# Patient Record
Sex: Female | Born: 1960
Health system: Southern US, Community
[De-identification: ages and names within clinical notes are randomized; demographics above are authoritative.]

## PROBLEM LIST (undated history)

## (undated) ENCOUNTER — Ambulatory Visit: Discharge status: Absent without leave | Payer: 59

## (undated) DIAGNOSIS — Z9889 Other specified postprocedural states: Secondary | ICD-10-CM

## (undated) DIAGNOSIS — R112 Nausea with vomiting, unspecified: Secondary | ICD-10-CM

## (undated) DIAGNOSIS — J302 Other seasonal allergic rhinitis: Secondary | ICD-10-CM

## (undated) DIAGNOSIS — I10 Essential (primary) hypertension: Secondary | ICD-10-CM

## (undated) HISTORY — DX: Other specified postprocedural states: Z98.890

## (undated) HISTORY — PX: WISDOM TOOTH EXTRACTION: SHX21

## (undated) HISTORY — PX: OTHER SURGICAL HISTORY: SHX169

## (undated) HISTORY — DX: Other seasonal allergic rhinitis: J30.2

## (undated) HISTORY — DX: Nausea with vomiting, unspecified: R11.2

---

## 1998-02-28 ENCOUNTER — Other Ambulatory Visit: Admission: RE | Admit: 1998-02-28 | Discharge: 1998-02-28 | Payer: Self-pay | Admitting: Obstetrics and Gynecology

## 1999-05-22 ENCOUNTER — Other Ambulatory Visit: Admission: RE | Admit: 1999-05-22 | Discharge: 1999-05-22 | Payer: Self-pay | Admitting: Obstetrics and Gynecology

## 2000-08-13 ENCOUNTER — Other Ambulatory Visit: Admission: RE | Admit: 2000-08-13 | Discharge: 2000-08-13 | Payer: Self-pay | Admitting: Obstetrics and Gynecology

## 2001-11-04 ENCOUNTER — Other Ambulatory Visit: Admission: RE | Admit: 2001-11-04 | Discharge: 2001-11-04 | Payer: Self-pay | Admitting: Obstetrics and Gynecology

## 2002-12-28 ENCOUNTER — Other Ambulatory Visit: Admission: RE | Admit: 2002-12-28 | Discharge: 2002-12-28 | Payer: Self-pay | Admitting: Obstetrics and Gynecology

## 2004-02-19 ENCOUNTER — Other Ambulatory Visit: Admission: RE | Admit: 2004-02-19 | Discharge: 2004-02-19 | Payer: Self-pay | Admitting: Obstetrics and Gynecology

## 2004-03-26 ENCOUNTER — Encounter: Admission: RE | Admit: 2004-03-26 | Discharge: 2004-03-26 | Payer: Self-pay | Admitting: Obstetrics and Gynecology

## 2004-09-26 ENCOUNTER — Encounter: Admission: RE | Admit: 2004-09-26 | Discharge: 2004-09-26 | Payer: Self-pay | Admitting: Obstetrics and Gynecology

## 2005-01-19 HISTORY — PX: CERVICAL SPINE SURGERY: SHX589

## 2005-03-13 ENCOUNTER — Other Ambulatory Visit: Admission: RE | Admit: 2005-03-13 | Discharge: 2005-03-13 | Payer: Self-pay | Admitting: Obstetrics and Gynecology

## 2005-04-08 ENCOUNTER — Encounter: Admission: RE | Admit: 2005-04-08 | Discharge: 2005-04-08 | Payer: Self-pay | Admitting: Neurological Surgery

## 2005-04-10 ENCOUNTER — Encounter: Admission: RE | Admit: 2005-04-10 | Discharge: 2005-04-10 | Payer: Self-pay | Admitting: Neurological Surgery

## 2005-04-16 ENCOUNTER — Ambulatory Visit (HOSPITAL_COMMUNITY): Admission: RE | Admit: 2005-04-16 | Discharge: 2005-04-16 | Payer: Self-pay | Admitting: Neurological Surgery

## 2005-05-19 ENCOUNTER — Encounter: Admission: RE | Admit: 2005-05-19 | Discharge: 2005-05-19 | Payer: Self-pay | Admitting: Neurological Surgery

## 2005-06-29 ENCOUNTER — Encounter: Admission: RE | Admit: 2005-06-29 | Discharge: 2005-06-29 | Payer: Self-pay | Admitting: Obstetrics and Gynecology

## 2005-07-01 ENCOUNTER — Encounter: Admission: RE | Admit: 2005-07-01 | Discharge: 2005-07-01 | Payer: Self-pay | Admitting: Obstetrics and Gynecology

## 2005-08-03 ENCOUNTER — Encounter: Admission: RE | Admit: 2005-08-03 | Discharge: 2005-08-03 | Payer: Self-pay | Admitting: Neurological Surgery

## 2010-02-09 ENCOUNTER — Encounter: Payer: Self-pay | Admitting: Obstetrics and Gynecology

## 2010-06-06 NOTE — Op Note (Signed)
NAME:  April Marsh, April Marsh               ACCOUNT NO.:  [id number]   MEDICAL RECORD NO.:  [id number]          PATIENT TYPE:  AMB   LOCATION:  SDS                          FACILITY:  [facility]   PHYSICIAN:  Tia Alert, MD     DATE OF BIRTH:  1960-07-31   DATE OF PROCEDURE:  04/16/2005  DATE OF DISCHARGE:                                 OPERATIVE REPORT   PREOPERATIVE DIAGNOSIS:  Cervical disc herniation C5-C6 on the right with  right C6 radiculopathy.   POSTOPERATIVE DIAGNOSIS:  Cervical disc herniation C5-C6 on the right with  right C6 radiculopathy.   PROCEDURE:  1.  Decompressive anterior cervical discectomy C5-C6.  2.  Anterior cervical arthrodesis C5-C6 utilizing a 6 mm PEEK interbody cage      packed with local autograft and DBX putty.  3.  Anterior cervical plating C5-C6 utilizing a 23 mm Avenger plate.   SURGEON:  Tia Alert, M.D.   ASSISTANT:  Reinaldo Meeker, M.D.   ANESTHESIA:  General endotracheal anesthesia.   COMPLICATIONS:  None apparent.   INDICATIONS FOR PROCEDURE:  April Marsh is a 50 year old white female who was  referred with neck and right arm pain.  She had tried medical management for  quite some time without significant relief.  She had an MRI and CT myelogram  which showed a herniated disk at C5-C6 on the right with cut off of the  right C6 nerve root sleeve. I recommended decompressive anterior cervical  discectomy and fusion with plating at C5-C6.  She understood the risks,  benefits, expected outcome, and wished to proceed.   DESCRIPTION OF PROCEDURE:  The patient was taken to the operating room  operating room and after induction of adequate general endotracheal  anesthesia, she was placed in the supine position on the operating room  table.  Her right anterior cervical region was prepped with DuraPrep and  draped in usual sterile fashion.  3 mL local anesthesia was injected and a  small transverse incision was made to the right of midline and  carried down  to the platysma which was elevated, opened and undermined with Metzenbaum  scissors.  I then dissected a plane medial to the sternocleidomastoid muscle  and internal carotid artery and lateral to the trachea and esophagus to  expose C5-C6.  Interoperative fluoroscopy confirmed my level and then the  longus colli muscles were taken down bilaterally and Shadowline retractors  were placed under these. The annulus at C5-C6 was incised and initial  discectomy was done with pituitary rongeurs and curved curettes.  We then  used the high-speed drill to drill the endplates.  These drill shavings were  saved in a mucous trap for later arthrodesis.  We drilled down to the level  of the posterior longitudinal ligament that and utilizing the operating  microscope, opened the posterior longitudinal ligament with a nerve hook and  then removed it in a circumferential fashion, undercutting the body C5 and  the top of C6.  Bilateral foraminotomies were performed but we performed a  wide foraminotomy at C5-C6 on the right side and  pulled several free  fragments from the foramen over the C6 nerve root.  We dissected to the  superior pedicle wall and followed the pedicle out distal to the pedicle  following the nerve distally into the foramen.  We then palpated with nerve  hook to assure adequate decompression. Once decompression was complete, we  irrigated with saline solution and then measured the interspace to be 6 mm.  We used a 6 mm PEEK interbody cage, packed this with local autograft saved  during drilling mixed with DBX putty, and tapped this into position at C5-C6  to perform arthrodesis.  We then used 23-mm ventral plate and placed two 13  mm variable angle screws into the bodies of C5 and C6.  These locked into  the plate by the plate's locking mechanism.  We then irrigated with saline  solution containing Bacitracin, dried all bleeding points with bipolar  cautery, and once  meticulous hemostasis was achieved, closed the platysma  with 3-0 Vicryl, closed the subcuticular tissue with 3-0 Vicryl, and closed  the skin with Benzoin and Steri-Strips.  The drapes removed, a sterile  dressing was applied.  The patient was awakened from general anesthesia and  transferred recovery room in stable condition.  At the end of procedure, all  sponge, needle and sponge counts were correct.      Tia Alert, MD  Electronically Signed     DSJ/MEDQ  D:  04/16/2005  T:  04/17/2005  Job:  [id number]

## 2011-05-06 ENCOUNTER — Other Ambulatory Visit: Payer: Self-pay | Admitting: Obstetrics and Gynecology

## 2011-05-06 DIAGNOSIS — R928 Other abnormal and inconclusive findings on diagnostic imaging of breast: Secondary | ICD-10-CM

## 2011-05-08 ENCOUNTER — Other Ambulatory Visit: Payer: Self-pay

## 2011-05-08 ENCOUNTER — Ambulatory Visit
Admission: RE | Admit: 2011-05-08 | Discharge: 2011-05-08 | Disposition: A | Discharge status: Home / Self Care | Payer: BC Managed Care – PPO | Source: Ambulatory Visit | Attending: Obstetrics and Gynecology | Admitting: Obstetrics and Gynecology

## 2011-05-08 ENCOUNTER — Other Ambulatory Visit: Payer: Self-pay | Admitting: Obstetrics and Gynecology

## 2011-05-08 DIAGNOSIS — R928 Other abnormal and inconclusive findings on diagnostic imaging of breast: Secondary | ICD-10-CM

## 2011-05-08 DIAGNOSIS — N6320 Unspecified lump in the left breast, unspecified quadrant: Secondary | ICD-10-CM

## 2011-05-19 ENCOUNTER — Other Ambulatory Visit: Payer: BC Managed Care – PPO

## 2011-05-20 ENCOUNTER — Ambulatory Visit
Admission: RE | Admit: 2011-05-20 | Discharge: 2011-05-20 | Disposition: A | Discharge status: Home / Self Care | Payer: BC Managed Care – PPO | Source: Ambulatory Visit | Attending: Obstetrics and Gynecology | Admitting: Obstetrics and Gynecology

## 2011-05-20 ENCOUNTER — Other Ambulatory Visit: Payer: Self-pay | Admitting: Obstetrics and Gynecology

## 2011-05-20 DIAGNOSIS — N6320 Unspecified lump in the left breast, unspecified quadrant: Secondary | ICD-10-CM

## 2011-05-20 DIAGNOSIS — R928 Other abnormal and inconclusive findings on diagnostic imaging of breast: Secondary | ICD-10-CM

## 2012-05-13 ENCOUNTER — Other Ambulatory Visit: Payer: Self-pay | Admitting: Obstetrics and Gynecology

## 2012-05-13 DIAGNOSIS — R928 Other abnormal and inconclusive findings on diagnostic imaging of breast: Secondary | ICD-10-CM

## 2012-05-30 ENCOUNTER — Other Ambulatory Visit: Payer: Self-pay | Admitting: Obstetrics and Gynecology

## 2012-05-30 DIAGNOSIS — R928 Other abnormal and inconclusive findings on diagnostic imaging of breast: Secondary | ICD-10-CM

## 2012-05-31 ENCOUNTER — Encounter (HOSPITAL_COMMUNITY): Payer: Self-pay

## 2012-05-31 ENCOUNTER — Ambulatory Visit (HOSPITAL_COMMUNITY)
Admission: RE | Admit: 2012-05-31 | Discharge: 2012-05-31 | Disposition: A | Discharge status: Home / Self Care | Payer: BC Managed Care – PPO | Source: Ambulatory Visit | Attending: Obstetrics and Gynecology | Admitting: Obstetrics and Gynecology

## 2012-05-31 VITALS — BP 142/86 | Temp 98.6°F | Ht <= 58 in | Wt 143.0 lb

## 2012-05-31 DIAGNOSIS — Z1239 Encounter for other screening for malignant neoplasm of breast: Secondary | ICD-10-CM

## 2012-05-31 DIAGNOSIS — N63 Unspecified lump in unspecified breast: Secondary | ICD-10-CM | POA: Insufficient documentation

## 2012-05-31 HISTORY — DX: Essential (primary) hypertension: I10

## 2012-05-31 NOTE — Patient Instructions (Addendum)
Taught patient how to perform BSE. Patient did not need a Pap smear today due to last Pap smear was 05/10/12 per patient. Told patient about free cervical cancer screenings to receive a Pap smear if would like one in 2 years. Let her know [facility] will cover Pap smears every 3 years unless has a history of abnormal Pap smears. Referred patient to the [facility] for right breast diagnostic mammogram and ultrasound per recommendation. Appointment scheduled for Thursday, Jun 09, 2012 at 0810. Patient aware of appointment and will be there. Patient verbalized understanding.

## 2012-05-31 NOTE — Progress Notes (Signed)
Patient referred to [facility] by the [facility] due to needing additional imaging of the right breast. Screening mammogram was completed 05/09/2012 at her physician's office.  Pap Smear:    Pap smear not completed today. Last Pap smear was 05/10/2012 and normal per patient. Per patient no history of an abnormal Pap smear. No Pap smear results in EPIC.  Physical exam: Breasts Breasts symmetrical. No skin abnormalities bilateral breasts. No nipple retraction bilateral breasts. No nipple discharge bilateral breasts. No lymphadenopathy. Palpated lump within right outer quadrant of breast. Palpated lump within the left breast at 6 o'clock under areola. No complaints of pain or tenderness on exam. Referred patient to the [facility] for right breast diagnostic mammogram and ultrasound per recommendation. Appointment scheduled for Thursday, Jun 09, 2012 at 0810.        Pelvic/Bimanual No Pap smear completed today since last Pap smear was 05/11/2011. Pap smear not indicated per [facility] guidelines.

## 2012-06-09 ENCOUNTER — Other Ambulatory Visit: Payer: BC Managed Care – PPO

## 2012-06-15 ENCOUNTER — Ambulatory Visit
Admission: RE | Admit: 2012-06-15 | Discharge: 2012-06-15 | Disposition: A | Discharge status: Home / Self Care | Payer: No Typology Code available for payment source | Source: Ambulatory Visit | Attending: Obstetrics and Gynecology | Admitting: Obstetrics and Gynecology

## 2012-06-15 DIAGNOSIS — R928 Other abnormal and inconclusive findings on diagnostic imaging of breast: Secondary | ICD-10-CM

## 2014-09-27 ENCOUNTER — Other Ambulatory Visit: Payer: Self-pay | Admitting: Obstetrics and Gynecology

## 2014-09-28 LAB — CYTOLOGY - PAP

## 2014-10-23 ENCOUNTER — Other Ambulatory Visit: Payer: Self-pay

## 2014-10-23 ENCOUNTER — Other Ambulatory Visit: Payer: Self-pay | Admitting: Obstetrics and Gynecology

## 2014-10-23 DIAGNOSIS — N6310 Unspecified lump in the right breast, unspecified quadrant: Secondary | ICD-10-CM

## 2014-10-26 ENCOUNTER — Other Ambulatory Visit: Payer: Self-pay

## 2014-11-01 ENCOUNTER — Ambulatory Visit
Admission: RE | Admit: 2014-11-01 | Discharge: 2014-11-01 | Disposition: A | Discharge status: Home / Self Care | Payer: PRIVATE HEALTH INSURANCE | Source: Ambulatory Visit | Attending: Obstetrics and Gynecology | Admitting: Obstetrics and Gynecology

## 2014-11-01 DIAGNOSIS — N6310 Unspecified lump in the right breast, unspecified quadrant: Secondary | ICD-10-CM

## 2016-06-24 DIAGNOSIS — M25572 Pain in left ankle and joints of left foot: Secondary | ICD-10-CM | POA: Diagnosis not present

## 2016-09-25 DIAGNOSIS — N390 Urinary tract infection, site not specified: Secondary | ICD-10-CM | POA: Diagnosis not present

## 2016-11-26 DIAGNOSIS — J329 Chronic sinusitis, unspecified: Secondary | ICD-10-CM | POA: Diagnosis not present

## 2016-11-26 DIAGNOSIS — J069 Acute upper respiratory infection, unspecified: Secondary | ICD-10-CM | POA: Diagnosis not present

## 2016-11-26 DIAGNOSIS — J209 Acute bronchitis, unspecified: Secondary | ICD-10-CM | POA: Diagnosis not present

## 2017-03-02 DIAGNOSIS — R350 Frequency of micturition: Secondary | ICD-10-CM | POA: Diagnosis not present

## 2017-03-02 DIAGNOSIS — R300 Dysuria: Secondary | ICD-10-CM | POA: Diagnosis not present

## 2017-03-22 DIAGNOSIS — E559 Vitamin D deficiency, unspecified: Secondary | ICD-10-CM | POA: Diagnosis not present

## 2017-03-22 DIAGNOSIS — Z6822 Body mass index (BMI) 22.0-22.9, adult: Secondary | ICD-10-CM | POA: Diagnosis not present

## 2017-03-22 DIAGNOSIS — Z01419 Encounter for gynecological examination (general) (routine) without abnormal findings: Secondary | ICD-10-CM | POA: Diagnosis not present

## 2017-03-22 DIAGNOSIS — Z1231 Encounter for screening mammogram for malignant neoplasm of breast: Secondary | ICD-10-CM | POA: Diagnosis not present

## 2017-03-22 DIAGNOSIS — N912 Amenorrhea, unspecified: Secondary | ICD-10-CM | POA: Diagnosis not present

## 2017-05-08 DIAGNOSIS — N3091 Cystitis, unspecified with hematuria: Secondary | ICD-10-CM | POA: Diagnosis not present

## 2017-08-11 ENCOUNTER — Ambulatory Visit: Payer: PRIVATE HEALTH INSURANCE | Admitting: Urology

## 2017-11-10 ENCOUNTER — Ambulatory Visit: Payer: BLUE CROSS/BLUE SHIELD | Admitting: Urology

## 2017-11-10 ENCOUNTER — Other Ambulatory Visit (HOSPITAL_COMMUNITY)
Admission: RE | Admit: 2017-11-10 | Discharge: 2017-11-10 | Disposition: A | Discharge status: Home / Self Care | Payer: BLUE CROSS/BLUE SHIELD | Source: Ambulatory Visit | Attending: Urology | Admitting: Urology

## 2017-11-10 DIAGNOSIS — R311 Benign essential microscopic hematuria: Secondary | ICD-10-CM

## 2017-11-10 DIAGNOSIS — R350 Frequency of micturition: Secondary | ICD-10-CM

## 2017-11-10 LAB — URINALYSIS, COMPLETE (UACMP) WITH MICROSCOPIC
Bilirubin Urine: NEGATIVE
Glucose, UA: NEGATIVE mg/dL
Ketones, ur: NEGATIVE mg/dL
LEUKOCYTES UA: NEGATIVE
Nitrite: NEGATIVE
PH: 6 (ref 5.0–8.0)
Protein, ur: NEGATIVE mg/dL
SPECIFIC GRAVITY, URINE: 1.018 (ref 1.005–1.030)

## 2017-12-22 ENCOUNTER — Ambulatory Visit: Payer: BLUE CROSS/BLUE SHIELD | Admitting: Urology

## 2017-12-22 DIAGNOSIS — R311 Benign essential microscopic hematuria: Secondary | ICD-10-CM

## 2017-12-22 DIAGNOSIS — R350 Frequency of micturition: Secondary | ICD-10-CM | POA: Diagnosis not present

## 2018-02-09 DIAGNOSIS — J209 Acute bronchitis, unspecified: Secondary | ICD-10-CM | POA: Diagnosis not present

## 2018-02-09 DIAGNOSIS — J069 Acute upper respiratory infection, unspecified: Secondary | ICD-10-CM | POA: Diagnosis not present

## 2018-03-23 ENCOUNTER — Ambulatory Visit: Payer: BLUE CROSS/BLUE SHIELD | Admitting: Urology

## 2018-09-12 DIAGNOSIS — M898X7 Other specified disorders of bone, ankle and foot: Secondary | ICD-10-CM | POA: Diagnosis not present

## 2018-09-12 DIAGNOSIS — M79671 Pain in right foot: Secondary | ICD-10-CM | POA: Diagnosis not present

## 2018-09-12 DIAGNOSIS — M19071 Primary osteoarthritis, right ankle and foot: Secondary | ICD-10-CM | POA: Diagnosis not present

## 2018-09-21 ENCOUNTER — Ambulatory Visit: Payer: BLUE CROSS/BLUE SHIELD | Admitting: Urology

## 2018-11-16 ENCOUNTER — Ambulatory Visit (INDEPENDENT_AMBULATORY_CARE_PROVIDER_SITE_OTHER): Payer: BC Managed Care – PPO | Admitting: Urology

## 2018-11-16 DIAGNOSIS — R350 Frequency of micturition: Secondary | ICD-10-CM

## 2018-11-16 DIAGNOSIS — R311 Benign essential microscopic hematuria: Secondary | ICD-10-CM | POA: Diagnosis not present

## 2018-11-18 ENCOUNTER — Other Ambulatory Visit: Payer: Self-pay

## 2018-11-18 DIAGNOSIS — Z20822 Contact with and (suspected) exposure to covid-19: Secondary | ICD-10-CM

## 2018-11-18 DIAGNOSIS — J069 Acute upper respiratory infection, unspecified: Secondary | ICD-10-CM | POA: Diagnosis not present

## 2018-11-18 DIAGNOSIS — B349 Viral infection, unspecified: Secondary | ICD-10-CM | POA: Diagnosis not present

## 2018-11-20 LAB — NOVEL CORONAVIRUS, NAA: SARS-CoV-2, NAA: DETECTED — AB

## 2019-02-13 ENCOUNTER — Telehealth: Payer: Self-pay | Admitting: Urology

## 2019-02-13 NOTE — Telephone Encounter (Signed)
Pt states insurance is not covering the Myrbetriq that Dr Alyson Ingles gave her. 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 Can she get more samples until her new insurance kicks in on March 20, 2019?

## 2019-02-14 NOTE — Telephone Encounter (Signed)
I spoke with pt. Can you have Debra or Hope put 25mg  myrbetriq in a bag for this pt? She will come by to get meds today/tomorrow.  She needs 4 boxes ( 1 months worth) of 25mg  Myrbetriq samples.

## 2019-03-10 ENCOUNTER — Other Ambulatory Visit: Payer: Self-pay

## 2019-03-10 DIAGNOSIS — N3281 Overactive bladder: Secondary | ICD-10-CM

## 2019-03-10 MED ORDER — FESOTERODINE FUMARATE ER 4 MG PO TB24: 4.0000 mg | ORAL_TABLET | Freq: Every day | ORAL | 11 refills | Status: DC

## 2019-03-22 ENCOUNTER — Other Ambulatory Visit: Payer: Self-pay

## 2019-03-22 DIAGNOSIS — N3281 Overactive bladder: Secondary | ICD-10-CM

## 2019-03-22 MED ORDER — OXYBUTYNIN CHLORIDE ER 10 MG PO TB24: 10.0000 mg | ORAL_TABLET | Freq: Every day | ORAL | 11 refills | Status: DC

## 2019-04-17 ENCOUNTER — Ambulatory Visit: Payer: Self-pay

## 2019-04-17 ENCOUNTER — Ambulatory Visit: Payer: Self-pay | Attending: Internal Medicine

## 2019-04-17 DIAGNOSIS — Z23 Encounter for immunization: Secondary | ICD-10-CM

## 2019-04-17 NOTE — Progress Notes (Signed)
   Covid-19 Vaccination Clinic  Name:  April Marsh    MRN: [id number] DOB: [birth year]  04/17/2019  [name] was observed post Covid-19 immunization for 15 minutes without incident. She was provided with Vaccine Information Sheet and instruction to access the V-Safe system.   [name] was instructed to call 911 with any severe reactions post vaccine: Marland Kitchen Difficulty breathing  . Swelling of face and throat  . A fast heartbeat  . A bad rash all over body  . Dizziness and weakness   Immunizations Administered    Name Date Dose VIS Date Route   Pfizer COVID-19 Vaccine 04/17/2019  1:04 PM 0.3 mL 12/30/2018 Intramuscular   Manufacturer: [place]   Lot: [id number]   [place]: [id number]

## 2019-05-15 ENCOUNTER — Ambulatory Visit: Payer: Self-pay | Attending: Internal Medicine

## 2019-05-15 DIAGNOSIS — Z23 Encounter for immunization: Secondary | ICD-10-CM

## 2019-05-15 NOTE — Progress Notes (Signed)
   Covid-19 Vaccination Clinic  Name:  April Marsh    MRN: [id number] DOB: [birth year]  05/15/2019  [name] was observed post Covid-19 immunization for 15 minutes without incident. She was provided with Vaccine Information Sheet and instruction to access the V-Safe system.   [name] was instructed to call 911 with any severe reactions post vaccine: Marland Kitchen Difficulty breathing  . Swelling of face and throat  . A fast heartbeat  . A bad rash all over body  . Dizziness and weakness   Immunizations Administered    Name Date Dose VIS Date Route   Pfizer COVID-19 Vaccine 05/15/2019 12:21 PM 0.3 mL 03/15/2018 Intramuscular   Manufacturer: [place]   Lot: [id number]   [place]: [id number]

## 2020-01-29 ENCOUNTER — Encounter: Payer: Self-pay | Admitting: Emergency Medicine

## 2020-01-29 ENCOUNTER — Other Ambulatory Visit: Payer: Self-pay

## 2020-01-29 ENCOUNTER — Ambulatory Visit
Admission: EM | Admit: 2020-01-29 | Discharge: 2020-01-29 | Disposition: A | Discharge status: Home / Self Care | Payer: 59 | Attending: Emergency Medicine | Admitting: Emergency Medicine

## 2020-01-29 DIAGNOSIS — J069 Acute upper respiratory infection, unspecified: Secondary | ICD-10-CM | POA: Diagnosis not present

## 2020-01-29 DIAGNOSIS — Z1152 Encounter for screening for COVID-19: Secondary | ICD-10-CM

## 2020-01-29 MED ORDER — FLUTICASONE PROPIONATE 50 MCG/ACT NA SUSP: 1.0000 | Freq: Every day | NASAL | 0 refills | Status: DC

## 2020-01-29 MED ORDER — BENZONATATE 100 MG PO CAPS: 100.0000 mg | ORAL_CAPSULE | Freq: Three times a day (TID) | ORAL | 0 refills | Status: DC | PRN

## 2020-01-29 MED ORDER — AZITHROMYCIN 250 MG PO TABS: 250.0000 mg | ORAL_TABLET | Freq: Every day | ORAL | 0 refills | Status: DC

## 2020-01-29 MED ORDER — CETIRIZINE HCL 10 MG PO TABS: 10.0000 mg | ORAL_TABLET | Freq: Every day | ORAL | 0 refills | Status: DC

## 2020-01-29 MED ORDER — DEXAMETHASONE 4 MG PO TABS: 4.0000 mg | ORAL_TABLET | Freq: Every day | ORAL | 0 refills | Status: AC

## 2020-01-29 NOTE — Discharge Instructions (Addendum)
COVID testing ordered.  It will take between 2-7 days for test results.  Someone will contact you regarding abnormal results.    Get plenty of rest and push fluids Tessalon Perles prescribed for cough Zyrtec for nasal congestion, runny nose, and/or sore throat Flonase for nasal congestion and runny nose Decadron was prescribed Azithromycin was prescribed  Use medications daily for symptom relief Use OTC medications like ibuprofen or tylenol as needed fever or pain Call or go to the ED if you have any new or worsening symptoms such as fever, worsening cough, shortness of breath, chest tightness, chest pain, turning blue, changes in mental status, etc..Marland Kitchen

## 2020-01-29 NOTE — ED Triage Notes (Signed)
Cough, congestion and runny nose since Friday.  Fever off and on.

## 2020-01-29 NOTE — ED Provider Notes (Signed)
[place]   [id number] 01/29/20 Arrival Time: 0845   CC: COVID symptoms  SUBJECTIVE: History from: patient.  April Marsh is a 60 y.o. female presented to the urgent care for complaint of  fever, cough, nasal congestion for the past 3-4 days.  Denies sick exposure to COVID, flu or strep.  Denies recent travel.  Has tried OTC medication without relief.  Denies any aggravating factors.  Denies previous symptoms in the past.   Denies fever, chills, fatigue, sinus pain, rhinorrhea, sore throat, SOB, wheezing, chest pain, nausea, changes in bowel or bladder habits.     ROS: As per HPI.  All other pertinent ROS negative.      Past Medical History:  Diagnosis Date  . Hypertension    Past Surgical History:  Procedure Laterality Date  . herniated disc     Allergies  Allergen Reactions  . Codeine   . Penicillins    No current facility-administered medications on file prior to encounter.   Current Outpatient Medications on File Prior to Encounter  Medication Sig Dispense Refill  . fesoterodine (TOVIAZ) 4 MG TB24 tablet Take 1 tablet (4 mg total) by mouth daily. 30 tablet 11  . norgestrel-ethinyl estradiol (LO/OVRAL,CRYSELLE) 0.3-30 MG-MCG tablet Take 1 tablet by mouth daily.    Marland Kitchen oxybutynin (DITROPAN-XL) 10 MG 24 hr tablet Take 1 tablet (10 mg total) by mouth daily. 30 tablet 11   Social History   Socioeconomic History  . Marital status: Legally Separated    Spouse name: Not on file  . Number of children: Not on file  . Years of education: Not on file  . Highest education level: Not on file  Occupational History  . Not on file  Tobacco Use  . Smoking status: Never Smoker  . Smokeless tobacco: Never Used  Substance and Sexual Activity  . Alcohol use: No  . Drug use: No  . Sexual activity: Not Currently  Other Topics Concern  . Not on file  Social History Narrative  . Not on file   Social Determinants of Health   Financial Resource Strain: Not  on file  Food Insecurity: Not on file  Transportation Needs: Not on file  Physical Activity: Not on file  Stress: Not on file  Social Connections: Not on file  Intimate Partner Violence: Not on file   Family History  Problem Relation Age of Onset  . Hypertension Mother   . Diabetes Maternal Uncle     OBJECTIVE:  Vitals:   01/29/20 0852 01/29/20 0853  BP: (!) 153/85   Pulse: 78   Resp: 18   Temp: 98.1 F (36.7 C)   TempSrc: Oral   SpO2: 98%   Weight:  145 lb (65.8 kg)  Height:  5\' 4"  (1.626 m)     General appearance: alert; appears fatigued, but nontoxic; speaking in full sentences and tolerating own secretions HEENT: NCAT; Ears: EACs clear, TMs pearly gray; Eyes: PERRL.  EOM grossly intact. Sinuses: nontender; Nose: nares patent without rhinorrhea, Throat: oropharynx clear, tonsils non erythematous or enlarged, uvula midline  Neck: supple without LAD Lungs: unlabored respirations, symmetrical air entry; cough: moderate; no respiratory distress; CTAB Heart: regular rate and rhythm.  Radial pulses 2+ symmetrical bilaterally Skin: warm and dry Psychological: alert and cooperative; normal mood and affect  LABS:  No results found for this or any previous visit (from the past 24 hour(s)).   ASSESSMENT & PLAN:  1. Encounter for screening for COVID-19   2. URI with  cough and congestion     Meds ordered this encounter  Medications  . cetirizine (ZYRTEC ALLERGY) 10 MG tablet    Sig: Take 1 tablet (10 mg total) by mouth daily.    Dispense:  30 tablet    Refill:  0  . fluticasone (FLONASE) 50 MCG/ACT nasal spray    Sig: Place 1 spray into both nostrils daily for 14 days.    Dispense:  16 g    Refill:  0  . benzonatate (TESSALON) 100 MG capsule    Sig: Take 1 capsule (100 mg total) by mouth 3 (three) times daily as needed for cough.    Dispense:  30 capsule    Refill:  0  . dexamethasone (DECADRON) 4 MG tablet    Sig: Take 1 tablet (4 mg total) by mouth daily for 7  days.    Dispense:  7 tablet    Refill:  0  . azithromycin (ZITHROMAX) 250 MG tablet    Sig: Take 1 tablet (250 mg total) by mouth daily. Take first 2 tablets together, then 1 every day until finished.    Dispense:  6 tablet    Refill:  0   Discharge Instructions  COVID testing ordered.  It will take between 2-7 days for test results.  Someone will contact you regarding abnormal results.    Get plenty of rest and push fluids Tessalon Perles prescribed for cough Zyrtec for nasal congestion, runny nose, and/or sore throat Flonase for nasal congestion and runny nose Decadron was prescribed Azithromycin was prescribed Use medications daily for symptom relief Use OTC medications like ibuprofen or tylenol as needed fever or pain Call or go to the ED if you have any new or worsening symptoms such as fever, worsening cough, shortness of breath, chest tightness, chest pain, turning blue, changes in mental status, etc...  Reviewed expectations re: course of current medical issues. Questions answered. Outlined signs and symptoms indicating need for more acute intervention. Patient verbalized understanding. After Visit Summary given.         Emerson Monte, [place] 01/29/20 [phone]

## 2020-01-31 LAB — COVID-19, FLU A+B NAA
Influenza A, NAA: NOT DETECTED
Influenza B, NAA: NOT DETECTED
SARS-CoV-2, NAA: NOT DETECTED

## 2020-02-01 ENCOUNTER — Encounter: Payer: Self-pay | Admitting: Emergency Medicine

## 2020-05-31 ENCOUNTER — Ambulatory Visit
Admission: EM | Admit: 2020-05-31 | Discharge: 2020-05-31 | Disposition: A | Discharge status: Home / Self Care | Payer: 59 | Attending: Emergency Medicine | Admitting: Emergency Medicine

## 2020-05-31 ENCOUNTER — Other Ambulatory Visit: Payer: Self-pay

## 2020-05-31 ENCOUNTER — Encounter: Payer: Self-pay | Admitting: Emergency Medicine

## 2020-05-31 DIAGNOSIS — R350 Frequency of micturition: Secondary | ICD-10-CM | POA: Diagnosis not present

## 2020-05-31 DIAGNOSIS — M545 Low back pain, unspecified: Secondary | ICD-10-CM

## 2020-05-31 LAB — POCT URINALYSIS DIP (MANUAL ENTRY)
Bilirubin, UA: NEGATIVE
Glucose, UA: NEGATIVE mg/dL
Ketones, POC UA: NEGATIVE mg/dL
Nitrite, UA: NEGATIVE
Protein Ur, POC: NEGATIVE mg/dL
Spec Grav, UA: 1.02 (ref 1.010–1.025)
Urobilinogen, UA: 0.2 E.U./dL
pH, UA: 6.5 (ref 5.0–8.0)

## 2020-05-31 MED ORDER — NITROFURANTOIN MONOHYD MACRO 100 MG PO CAPS: 100.0000 mg | ORAL_CAPSULE | Freq: Two times a day (BID) | ORAL | 0 refills | Status: DC

## 2020-05-31 NOTE — ED Provider Notes (Signed)
[place]   CC: back pain and urinary frequency  SUBJECTIVE:  April Marsh is a 60 y.o. female who complains of RT sided back pain and urinary frequency x 3 days.  Patient denies a precipitating event, recent sexual encounter, excessive caffeine intake.  Has tried OTC medications without relief.  Symptoms are made worse with urination.  Admits to similar symptoms in the past.  Denies fever, chills, nausea, vomiting, abdominal pain, flank pain, abnormal vaginal discharge or bleeding, hematuria.    LMP: No LMP recorded. Patient is postmenopausal.  ROS: As in HPI.  All other pertinent ROS negative.     Past Medical History:  Diagnosis Date  . Hypertension    Past Surgical History:  Procedure Laterality Date  . herniated disc     Allergies  Allergen Reactions  . Codeine   . Penicillins    No current facility-administered medications on file prior to encounter.   Current Outpatient Medications on File Prior to Encounter  Medication Sig Dispense Refill  . azithromycin (ZITHROMAX) 250 MG tablet Take 1 tablet (250 mg total) by mouth daily. Take first 2 tablets together, then 1 every day until finished. 6 tablet 0  . benzonatate (TESSALON) 100 MG capsule Take 1 capsule (100 mg total) by mouth 3 (three) times daily as needed for cough. 30 capsule 0  . cetirizine (ZYRTEC ALLERGY) 10 MG tablet Take 1 tablet (10 mg total) by mouth daily. 30 tablet 0  . fesoterodine (TOVIAZ) 4 MG TB24 tablet Take 1 tablet (4 mg total) by mouth daily. 30 tablet 11  . fluticasone (FLONASE) 50 MCG/ACT nasal spray Place 1 spray into both nostrils daily for 14 days. 16 g 0  . norgestrel-ethinyl estradiol (LO/OVRAL,CRYSELLE) 0.3-30 MG-MCG tablet Take 1 tablet by mouth daily.    Marland Kitchen oxybutynin (DITROPAN-XL) 10 MG 24 hr tablet Take 1 tablet (10 mg total) by mouth daily. 30 tablet 11   Social History   Socioeconomic History  . Marital status: Single    Spouse name: Not on file  . Number of  children: Not on file  . Years of education: Not on file  . Highest education level: Not on file  Occupational History  . Not on file  Tobacco Use  . Smoking status: Never Smoker  . Smokeless tobacco: Never Used  Substance and Sexual Activity  . Alcohol use: No  . Drug use: No  . Sexual activity: Not Currently  Other Topics Concern  . Not on file  Social History Narrative   ** Merged History Encounter **       Social Determinants of Health   Financial Resource Strain: Not on file  Food Insecurity: Not on file  Transportation Needs: Not on file  Physical Activity: Not on file  Stress: Not on file  Social Connections: Not on file  Intimate Partner Violence: Not on file   Family History  Problem Relation Age of Onset  . Hypertension Mother   . Diabetes Maternal Uncle     OBJECTIVE:  Vitals:   05/31/20 1053  BP: (!) 169/83  Pulse: 66  Resp: 19  Temp: 98.3 F (36.8 C)  TempSrc: Oral  SpO2: 98%   General appearance: Alert in no acute distress HEENT: NCAT.  Oropharynx clear.  Lungs: clear to auscultation bilaterally without adventitious breath sounds Heart: regular rate and rhythm.   Abdomen: soft; non-distended; no tenderness; bowel sounds present; no guarding Back: no CVA tenderness Extremities: no edema; symmetrical with no gross deformities Skin:  warm and dry Neurologic: Ambulates from chair to exam table without difficulty Psychological: alert and cooperative; normal mood and affect  Labs Reviewed  POCT URINALYSIS DIP (MANUAL ENTRY) - Abnormal; Notable for the following components:      Result Value   Blood, UA small (*)    Leukocytes, UA Trace (*)    All other components within normal limits  URINE CULTURE   ASSESSMENT & PLAN:  1. Acute right-sided low back pain without sciatica   2. Urinary frequency     Meds ordered this encounter  Medications  . nitrofurantoin, macrocrystal-monohydrate, (MACROBID) 100 MG capsule    Sig: Take 1 capsule (100  mg total) by mouth 2 (two) times daily.    Dispense:  10 capsule    Refill:  0    Order Specific Question:   Supervising Provider    Answer:   Raylene Everts [[id number]]   Urine concerning for infection Urine culture sent.  We will call you with the results.   Push fluids and get plenty of rest.   Take antibiotic as directed and to completion Follow up with PCP if symptoms persists Return here or go to ER if you have any new or worsening symptoms such as fever, worsening abdominal pain, nausea/vomiting, flank pain, etc...  Outlined signs and symptoms indicating need for more acute intervention. Patient verbalized understanding. After Visit Summary given.     Lestine Box, PA-C 05/31/20 1130

## 2020-05-31 NOTE — Discharge Instructions (Signed)
Urine concerning for infection Urine culture sent.  We will call you with the results.   Push fluids and get plenty of rest.   Take antibiotic as directed and to completion Follow up with PCP if symptoms persists Return here or go to ER if you have any new or worsening symptoms such as fever, worsening abdominal pain, nausea/vomiting, flank pain, etc... 

## 2020-05-31 NOTE — ED Triage Notes (Signed)
Lower RT side back pain and urinary frequency since Tuesday.

## 2020-06-02 LAB — URINE CULTURE

## 2020-06-04 ENCOUNTER — Encounter: Payer: Self-pay | Admitting: Emergency Medicine

## 2020-06-04 ENCOUNTER — Ambulatory Visit
Admission: EM | Admit: 2020-06-04 | Discharge: 2020-06-04 | Disposition: A | Discharge status: Home / Self Care | Payer: 59 | Attending: Family Medicine | Admitting: Family Medicine

## 2020-06-04 ENCOUNTER — Other Ambulatory Visit: Payer: Self-pay

## 2020-06-04 DIAGNOSIS — R1030 Lower abdominal pain, unspecified: Secondary | ICD-10-CM | POA: Diagnosis present

## 2020-06-04 DIAGNOSIS — R109 Unspecified abdominal pain: Secondary | ICD-10-CM | POA: Insufficient documentation

## 2020-06-04 LAB — POCT URINALYSIS DIP (MANUAL ENTRY)
Glucose, UA: NEGATIVE mg/dL
Ketones, POC UA: NEGATIVE mg/dL
Nitrite, UA: NEGATIVE
Protein Ur, POC: 30 mg/dL — AB
Spec Grav, UA: >=1.03 — AB (ref 1.010–1.025)
Urobilinogen, UA: 0.2 E.U./dL
pH, UA: 5.5 (ref 5.0–8.0)

## 2020-06-04 NOTE — ED Provider Notes (Signed)
[facility]    CSN: [id number] Arrival date & time: 06/04/20  1015      History   Chief Complaint Chief Complaint  Patient presents with  . Urinary Tract Infection    HPI April Marsh is a 60 y.o. female.   Left flank pain for the last 3 days.  She was seen in this office on 05/31/2020 and was treated with Macrobid.  Reports she is still having pain.  Urine culture resulted multiple species and recollection was suggested.  She is here to provide another specimen.  Has not attempted other treatment at home.  Denies abdominal pain, fever, chills, body aches, other symptoms.  ROS per HPI   The history is provided by the patient.    Past Medical History:  Diagnosis Date  . Hypertension     Patient Active Problem List   Diagnosis Date Noted  . Breast lump 05/31/2012    Past Surgical History:  Procedure Laterality Date  . herniated disc      OB History   No obstetric history on file.      Home Medications    Prior to Admission medications   Medication Sig Start Date End Date Taking? Authorizing Provider  azithromycin (ZITHROMAX) 250 MG tablet Take 1 tablet (250 mg total) by mouth daily. Take first 2 tablets together, then 1 every day until finished. 01/29/20   Avegno, Darrelyn Hillock, FNP  benzonatate (TESSALON) 100 MG capsule Take 1 capsule (100 mg total) by mouth 3 (three) times daily as needed for cough. 01/29/20   Avegno, Darrelyn Hillock, FNP  cetirizine (ZYRTEC ALLERGY) 10 MG tablet Take 1 tablet (10 mg total) by mouth daily. 01/29/20   Avegno, Darrelyn Hillock, FNP  fesoterodine (TOVIAZ) 4 MG TB24 tablet Take 1 tablet (4 mg total) by mouth daily. 03/10/19   McKenzie, Candee Furbish, MD  fluticasone (FLONASE) 50 MCG/ACT nasal spray Place 1 spray into both nostrils daily for 14 days. 01/29/20 02/12/20  Avegno, Darrelyn Hillock, FNP  nitrofurantoin, macrocrystal-monohydrate, (MACROBID) 100 MG capsule Take 1 capsule (100 mg total) by mouth 2 (two) times daily. 05/31/20    Wurst, [place], PA-C  norgestrel-ethinyl estradiol (LO/OVRAL,CRYSELLE) 0.3-30 MG-MCG tablet Take 1 tablet by mouth daily.    [provider]  oxybutynin (DITROPAN-XL) 10 MG 24 hr tablet Take 1 tablet (10 mg total) by mouth daily. 03/22/19   McKenzie, Candee Furbish, MD    Family History Family History  Problem Relation Age of Onset  . Hypertension Mother   . Diabetes Maternal Uncle     Social History Social History   Tobacco Use  . Smoking status: Never Smoker  . Smokeless tobacco: Never Used  Substance Use Topics  . Alcohol use: No  . Drug use: No     Allergies   Codeine and Penicillins   Review of Systems Review of Systems   Physical Exam Triage Vital Signs ED Triage Vitals  Enc Vitals Group     BP 06/04/20 1041 (!) 161/83     Pulse Rate 06/04/20 1041 70     Resp 06/04/20 1041 18     Temp 06/04/20 1041 98.2 F (36.8 C)     Temp Source 06/04/20 1041 Oral     SpO2 06/04/20 1041 98 %     Weight --      Height --      Head Circumference --      Peak Flow --      Pain Score 06/04/20 1043 0  Pain Loc --      Pain Edu? --      Excl. in [place]? --    No data found.  Updated Vital Signs BP (!) 161/83   Pulse 70   Temp 98.2 F (36.8 C) (Oral)   Resp 18   SpO2 98%   Visual Acuity Right Eye Distance:   Left Eye Distance:   Bilateral Distance:    Right Eye Near:   Left Eye Near:    Bilateral Near:     Physical Exam Vitals and nursing note reviewed.  Constitutional:      General: She is not in acute distress.    Appearance: Normal appearance. She is well-developed and normal weight. She is not ill-appearing.  HENT:     Head: Normocephalic and atraumatic.     Nose: Nose normal.     Mouth/Throat:     Mouth: Mucous membranes are moist.     Pharynx: Oropharynx is clear.  Eyes:     Extraocular Movements: Extraocular movements intact.     Conjunctiva/sclera: Conjunctivae normal.     Pupils: Pupils are equal, round, and reactive to light.   Cardiovascular:     Rate and Rhythm: Normal rate.  Pulmonary:     Effort: Pulmonary effort is normal.  Abdominal:     Palpations: Abdomen is soft.     Tenderness: There is abdominal tenderness (Suprapubic).  Musculoskeletal:        General: Normal range of motion.     Cervical back: Normal range of motion and neck supple.  Skin:    General: Skin is warm and dry.     Capillary Refill: Capillary refill takes less than 2 seconds.  Neurological:     General: No focal deficit present.     Mental Status: She is alert and oriented to person, place, and time.  Psychiatric:        Mood and Affect: Mood normal.        Behavior: Behavior normal.        Thought Content: Thought content normal.      UC Treatments / Results  Labs (all labs ordered are listed, but only abnormal results are displayed) Labs Reviewed  POCT URINALYSIS DIP (MANUAL ENTRY) - Abnormal; Notable for the following components:      Result Value   Color, UA straw (*)    Bilirubin, UA small (*)    Spec Grav, UA >=1.030 (*)    Blood, UA trace-lysed (*)    Protein Ur, POC =30 (*)    Leukocytes, UA Trace (*)    All other components within normal limits  URINE CULTURE    EKG   Radiology No results found.  Procedures Procedures (including critical care time)  Medications Ordered in UC Medications - No data to display  Initial Impression / Assessment and Plan / UC Course  I have reviewed the triage vital signs and the nursing notes.  Pertinent labs & imaging results that were available during my care of the patient were reviewed by me and considered in my medical decision making (see chart for details).    Left flank pain Low abdominal pain  Will send urine for culture she has been taking Macrobid We will follow-up with any abnormal results that require further treatment Follow up with this office or with primary care if symptoms are persisting.  Follow up in the ER for high fever, trouble swallowing,  trouble breathing, other concerning symptoms.   Final Clinical Impressions(s) / UC  Diagnoses   Final diagnoses:  Left flank pain  Lower abdominal pain     Discharge Instructions     You may have a urinary tract infection.   Finish your medication  We are going to culture your urine and will call you as soon as we have the results.   Drink plenty of water, 8-10 glasses per day.   You may take AZO over the counter for painful urination.  Follow up with your primary care provider as needed.   Go to the Emergency Department if you experience severe pain, shortness of breath, high fever, or other concerns.      ED Prescriptions    None     PDMP not reviewed this encounter.   Faustino Congress, NP 06/05/20 1658

## 2020-06-04 NOTE — ED Triage Notes (Signed)
Pt called to come back in for re collect on urine.  Pt reports she is still having s/s uti

## 2020-06-04 NOTE — Discharge Instructions (Signed)
You may have a urinary tract infection.   Finish your medication  We are going to culture your urine and will call you as soon as we have the results.   Drink plenty of water, 8-10 glasses per day.   You may take AZO over the counter for painful urination.  Follow up with your primary care provider as needed.   Go to the Emergency Department if you experience severe pain, shortness of breath, high fever, or other concerns.

## 2020-06-06 LAB — URINE CULTURE

## 2020-06-07 ENCOUNTER — Ambulatory Visit (INDEPENDENT_AMBULATORY_CARE_PROVIDER_SITE_OTHER): Payer: 59 | Admitting: Urology

## 2020-06-07 ENCOUNTER — Other Ambulatory Visit: Payer: Self-pay

## 2020-06-07 VITALS — BP 146/79 | HR 73 | Temp 98.1°F | Ht 64.0 in | Wt 140.0 lb

## 2020-06-07 DIAGNOSIS — N3281 Overactive bladder: Secondary | ICD-10-CM | POA: Diagnosis not present

## 2020-06-07 DIAGNOSIS — R3129 Other microscopic hematuria: Secondary | ICD-10-CM | POA: Insufficient documentation

## 2020-06-07 LAB — URINALYSIS, ROUTINE W REFLEX MICROSCOPIC
Bilirubin, UA: NEGATIVE
Glucose, UA: NEGATIVE
Ketones, UA: NEGATIVE
Leukocytes,UA: NEGATIVE
Nitrite, UA: NEGATIVE
Protein,UA: NEGATIVE
Specific Gravity, UA: 1.025 (ref 1.005–1.030)
Urobilinogen, Ur: 0.2 mg/dL (ref 0.2–1.0)
pH, UA: 5.5 (ref 5.0–7.5)

## 2020-06-07 LAB — MICROSCOPIC EXAMINATION
Bacteria, UA: NONE SEEN
Renal Epithel, UA: NONE SEEN /hpf

## 2020-06-07 LAB — BLADDER SCAN AMB NON-IMAGING: Scan Result: 4

## 2020-06-07 MED ORDER — GEMTESA 75 MG PO TABS: 1.0000 | ORAL_TABLET | Freq: Every day | ORAL | 0 refills | Status: DC

## 2020-06-07 NOTE — Progress Notes (Signed)
post void residual=4  Urological Symptom Review  Patient is experiencing the following symptoms: Frequent urination Get up at night to urinate Leakage of urine Blood in urine Urinary tract infection   Review of Systems  Gastrointestinal (upper)  : Negative for upper GI symptoms  Gastrointestinal (lower) : Negative for lower GI symptoms  Constitutional : Negative for symptoms  Skin: Negative for skin symptoms  Eyes: Negative for eye symptoms  Ear/Nose/Throat : Negative for Ear/Nose/Throat symptoms  Hematologic/Lymphatic: Easy bruising  Cardiovascular : Negative for cardiovascular symptoms  Respiratory : Negative for respiratory symptoms  Endocrine: Negative for endocrine symptoms  Musculoskeletal: Back pain  Neurological: Negative for neurological symptoms  Psychologic: Negative for psychiatric symptoms

## 2020-06-07 NOTE — Progress Notes (Signed)
06/07/2020 9:40 AM   April Marsh [birth year] [id number]  Referring provider: No referring provider defined for this encounter.  Microhematuria  HPI: April Marsh is a 60yo here for microhematuria and OAB. She is having right flank pain for the past 2 weeks. She has no abdominal imaging. SHe was treated for a UTI for the past weak. No tobacco abuse history. She was previously seen by me fro OAB but has not been seen in 2 years. No history of nephrolithiasis   PMH: Past Medical History:  Diagnosis Date  . Hypertension     Surgical History: Past Surgical History:  Procedure Laterality Date  . herniated disc      Home Medications:  Allergies as of 06/07/2020      Reactions   Codeine    Penicillins       Medication List       Accurate as of Jun 07, 2020  9:40 AM. If you have any questions, ask your nurse or doctor.        azithromycin 250 MG tablet Commonly known as: ZITHROMAX Take 1 tablet (250 mg total) by mouth daily. Take first 2 tablets together, then 1 every day until finished.   benzonatate 100 MG capsule Commonly known as: TESSALON Take 1 capsule (100 mg total) by mouth 3 (three) times daily as needed for cough.   cetirizine 10 MG tablet Commonly known as: ZyrTEC Allergy Take 1 tablet (10 mg total) by mouth daily.   fesoterodine 4 MG Tb24 tablet Commonly known as: TOVIAZ Take 1 tablet (4 mg total) by mouth daily.   fluticasone 50 MCG/ACT nasal spray Commonly known as: FLONASE Place 1 spray into both nostrils daily for 14 days.   nitrofurantoin (macrocrystal-monohydrate) 100 MG capsule Commonly known as: MACROBID Take 1 capsule (100 mg total) by mouth 2 (two) times daily.   norgestrel-ethinyl estradiol 0.3-30 MG-MCG tablet Commonly known as: LO/OVRAL Take 1 tablet by mouth daily.   oxybutynin 10 MG 24 hr tablet Commonly known as: DITROPAN-XL Take 1 tablet (10 mg total) by mouth daily.       Allergies:  Allergies  Allergen  Reactions  . Codeine   . Penicillins     Family History: Family History  Problem Relation Age of Onset  . Hypertension Mother   . Diabetes Maternal Uncle     Social History:  reports that she has never smoked. She has never used smokeless tobacco. She reports that she does not drink alcohol and does not use drugs.  ROS: All other review of systems were reviewed and are negative except what is noted above in HPI  Physical Exam: BP (!) 146/79   Pulse 73   Temp 98.1 F (36.7 C)   Ht 5\' 4"  (1.626 m)   Wt 140 lb (63.5 kg)   BMI 24.03 kg/m   Constitutional:  Alert and oriented, No acute distress. HEENT: [place] AT, moist mucus membranes.  Trachea midline, no masses. Cardiovascular: No clubbing, cyanosis, or edema. Respiratory: Normal respiratory effort, no increased work of breathing. GI: Abdomen is soft, nontender, nondistended, no abdominal masses GU: No CVA tenderness.  Lymph: No cervical or inguinal lymphadenopathy. Skin: No rashes, bruises or suspicious lesions. Neurologic: Grossly intact, no focal deficits, moving all 4 extremities. Psychiatric: Normal mood and affect.  Laboratory Data: No results found for: WBC, HGB, HCT, MCV, PLT  No results found for: CREATININE  No results found for: PSA  No results found for: TESTOSTERONE  No results found for: HGBA1C  Urinalysis    Component Value Date/Time   COLORURINE YELLOW 11/10/2017 1145   APPEARANCEUR CLEAR 11/10/2017 1145   LABSPEC 1.018 11/10/2017 1145   PHURINE 6.0 11/10/2017 1145   GLUCOSEU NEGATIVE 11/10/2017 1145   HGBUR SMALL (A) 11/10/2017 1145   BILIRUBINUR small (A) 06/04/2020 1052   KETONESUR negative 06/04/2020 1052   KETONESUR NEGATIVE 11/10/2017 1145   PROTEINUR =30 (A) 06/04/2020 1052   PROTEINUR NEGATIVE 11/10/2017 1145   UROBILINOGEN 0.2 06/04/2020 1052   NITRITE Negative 06/04/2020 1052   NITRITE NEGATIVE 11/10/2017 1145   LEUKOCYTESUR Trace (A) 06/04/2020 1052    Lab Results  Component  Value Date   BACTERIA FEW (A) 11/10/2017    Pertinent Imaging:  No results found for this or any previous visit.  No results found for this or any previous visit.  No results found for this or any previous visit.  No results found for this or any previous visit.  No results found for this or any previous visit.  No results found for this or any previous visit.  No results found for this or any previous visit.  No results found for this or any previous visit.   Assessment & Plan:    1. OAB (overactive bladder) -We will trial gemtesa 75mg  daily - Urinalysis, Routine w reflex microscopic - BLADDER SCAN AMB NON-IMAGING  2. Microhematuria -BMP -CT hematuria -office cystoscopy   No follow-ups on file.  Nicolette Bang, MD  [facility] [place]

## 2020-06-08 LAB — BASIC METABOLIC PANEL
BUN/Creatinine Ratio: 30 — ABNORMAL HIGH (ref 9–23)
BUN: 17 mg/dL (ref 6–24)
CO2: 24 mmol/L (ref 20–29)
Calcium: 9.2 mg/dL (ref 8.7–10.2)
Chloride: 102 mmol/L (ref 96–106)
Creatinine, Ser: 0.56 mg/dL — ABNORMAL LOW (ref 0.57–1.00)
Glucose: 102 mg/dL — ABNORMAL HIGH (ref 65–99)
Potassium: 3.4 mmol/L — ABNORMAL LOW (ref 3.5–5.2)
Sodium: 141 mmol/L (ref 134–144)
eGFR: 106 mL/min/{1.73_m2} (ref >59)

## 2020-06-18 ENCOUNTER — Encounter: Payer: Self-pay | Admitting: Urology

## 2020-06-18 ENCOUNTER — Ambulatory Visit: Payer: 59 | Admitting: Urology

## 2020-06-18 NOTE — Patient Instructions (Signed)

## 2020-07-04 ENCOUNTER — Ambulatory Visit (HOSPITAL_COMMUNITY)
Admission: RE | Admit: 2020-07-04 | Discharge: 2020-07-04 | Disposition: A | Discharge status: Home / Self Care | Payer: 59 | Source: Ambulatory Visit | Attending: Urology | Admitting: Urology

## 2020-07-04 DIAGNOSIS — R3129 Other microscopic hematuria: Secondary | ICD-10-CM | POA: Diagnosis present

## 2020-07-04 MED ORDER — IOHEXOL 300 MG/ML  SOLN
100.0000 mL | Freq: Once | INTRAMUSCULAR | Status: AC | PRN
  Administered 2020-07-04: 100 mL via INTRAVENOUS

## 2020-07-04 MED ORDER — IOHEXOL 300 MG/ML  SOLN
25.0000 mL | Freq: Once | INTRAMUSCULAR | Status: AC | PRN
  Administered 2020-07-04: 25 mL via INTRAVENOUS

## 2020-07-10 ENCOUNTER — Telehealth: Payer: Self-pay

## 2020-07-10 NOTE — Telephone Encounter (Signed)
Unable to reach patient by phone. Message sent via my chart

## 2020-07-10 NOTE — Telephone Encounter (Signed)
Patient was returning a phone call. Wanted someone to call her back.

## 2020-07-10 NOTE — Telephone Encounter (Signed)
Patient was returning a call want a call back.

## 2020-07-12 NOTE — Telephone Encounter (Signed)
Called patient and questions addressed.

## 2020-07-19 ENCOUNTER — Other Ambulatory Visit: Payer: 59 | Admitting: Urology

## 2020-07-19 ENCOUNTER — Other Ambulatory Visit: Payer: Self-pay

## 2020-09-17 NOTE — Progress Notes (Signed)
Sent via mychart

## 2020-10-14 ENCOUNTER — Ambulatory Visit (INDEPENDENT_AMBULATORY_CARE_PROVIDER_SITE_OTHER): Payer: 59 | Admitting: Urology

## 2020-10-14 ENCOUNTER — Encounter: Payer: Self-pay | Admitting: Urology

## 2020-10-14 ENCOUNTER — Other Ambulatory Visit: Payer: Self-pay

## 2020-10-14 VITALS — BP 166/77 | HR 70

## 2020-10-14 DIAGNOSIS — N3281 Overactive bladder: Secondary | ICD-10-CM

## 2020-10-14 DIAGNOSIS — R3129 Other microscopic hematuria: Secondary | ICD-10-CM

## 2020-10-14 LAB — URINALYSIS, ROUTINE W REFLEX MICROSCOPIC
Bilirubin, UA: NEGATIVE
Glucose, UA: NEGATIVE
Ketones, UA: NEGATIVE
Leukocytes,UA: NEGATIVE
Nitrite, UA: NEGATIVE
Protein,UA: NEGATIVE
Specific Gravity, UA: <1.005 — ABNORMAL LOW (ref 1.005–1.030)
Urobilinogen, Ur: 0.2 mg/dL (ref 0.2–1.0)
pH, UA: 6 (ref 5.0–7.5)

## 2020-10-14 LAB — MICROSCOPIC EXAMINATION: Renal Epithel, UA: NONE SEEN /hpf

## 2020-10-14 MED ORDER — CIPROFLOXACIN HCL 500 MG PO TABS
500.0000 mg | ORAL_TABLET | Freq: Once | ORAL | Status: AC
  Administered 2020-10-14: 500 mg via ORAL

## 2020-10-14 NOTE — Progress Notes (Signed)
   10/14/20  CC: microhematuria   HPI: Ms Lanuza is a 60yo here for followup for microhematuria. CT hematuria protocol showed no abnormalities.  Blood pressure (!) 166/77, pulse 70. NED. A&Ox3.   No respiratory distress   Abd soft, NT, ND Normal external genitalia with patent urethral meatus  Cystoscopy Procedure Note  Patient identification was confirmed, informed consent was obtained, and patient was prepped using Betadine solution.  Lidocaine jelly was administered per urethral meatus.    Procedure: - Flexible cystoscope introduced, without any difficulty.   - Thorough search of the bladder revealed:    normal urethral meatus    normal urothelium    no stones    no ulcers     no tumors    no urethral polyps    no trabeculation  - Ureteral orifices were normal in position and appearance.  Post-Procedure: - Patient tolerated the procedure well  Assessment/ Plan: RTC 1 year with renal US   No follow-ups on file.  Nicolette Bang, MD

## 2020-10-14 NOTE — Progress Notes (Signed)
Urological Symptom Review  Patient is experiencing the following symptoms: Frequent urination Get up at night to urinate Urinary tract infection   Review of Systems  Gastrointestinal (upper)  : Negative for upper GI symptoms  Gastrointestinal (lower) : Negative for lower GI symptoms  Constitutional : Negative for symptoms  Skin: Negative for skin symptoms  Eyes: Negative for eye symptoms  Ear/Nose/Throat : Negative for Ear/Nose/Throat symptoms  Hematologic/Lymphatic: Negative for Hematologic/Lymphatic symptoms  Cardiovascular : Negative for cardiovascular symptoms  Respiratory : Negative for respiratory symptoms  Endocrine: Negative for endocrine symptoms  Musculoskeletal: Negative for musculoskeletal symptoms  Neurological: Negative for neurological symptoms  Psychologic: Negative for psychiatric symptoms

## 2020-10-14 NOTE — Patient Instructions (Signed)

## 2020-11-18 ENCOUNTER — Encounter: Payer: Self-pay | Admitting: Gastroenterology

## 2020-12-20 DIAGNOSIS — I10 Essential (primary) hypertension: Secondary | ICD-10-CM | POA: Insufficient documentation

## 2020-12-20 DIAGNOSIS — M8580 Other specified disorders of bone density and structure, unspecified site: Secondary | ICD-10-CM | POA: Insufficient documentation

## 2021-01-13 ENCOUNTER — Other Ambulatory Visit: Payer: Self-pay

## 2021-01-13 ENCOUNTER — Ambulatory Visit
Admission: EM | Admit: 2021-01-13 | Discharge: 2021-01-13 | Disposition: A | Discharge status: Home / Self Care | Payer: 59 | Attending: Family Medicine | Admitting: Family Medicine

## 2021-01-13 DIAGNOSIS — J069 Acute upper respiratory infection, unspecified: Secondary | ICD-10-CM

## 2021-01-13 MED ORDER — PROMETHAZINE-DM 6.25-15 MG/5ML PO SYRP: 5.0000 mL | ORAL_SOLUTION | Freq: Four times a day (QID) | ORAL | 0 refills | Status: DC | PRN

## 2021-01-13 MED ORDER — PREDNISONE 20 MG PO TABS: 40.0000 mg | ORAL_TABLET | Freq: Every day | ORAL | 0 refills | Status: DC

## 2021-01-13 NOTE — ED Provider Notes (Signed)
[facility]    CSN: [id number] Arrival date & time: 01/13/21  0850      History   Chief Complaint Chief Complaint  Patient presents with   Fever    Congestion, cough and fever    HPI April Marsh is a 60 y.o. female.   Patient presenting today with 4-day history of productive cough, fever, chills, nasal congestion.  Denies chest pain, shortness of breath, abdominal pain, nausea vomiting or diarrhea.  Patient states that she gets something like this every fall and winter and always gets a Z-Pak and feels better with that.  She denies history of seasonal allergies, pulmonary disease.  Has been taking over-the-counter cold and congestion medications with no relief.  No known sick contacts recently.  Declines COVID and flu testing.  Past Medical History:  Diagnosis Date   Hypertension     Patient Active Problem List   Diagnosis Date Noted   OAB (overactive bladder) 06/07/2020   Microhematuria 06/07/2020   Breast lump 05/31/2012    Past Surgical History:  Procedure Laterality Date   herniated disc      OB History   No obstetric history on file.      Home Medications    Prior to Admission medications   Medication Sig Start Date End Date Taking? Authorizing Provider  predniSONE (DELTASONE) 20 MG tablet Take 2 tablets (40 mg total) by mouth daily with breakfast. 01/13/21  Yes Volney American, PA-C  promethazine-dextromethorphan (PROMETHAZINE-DM) 6.25-15 MG/5ML syrup Take 5 mLs by mouth 4 (four) times daily as needed. 01/13/21  Yes Volney American, PA-C  azithromycin (ZITHROMAX) 250 MG tablet Take 1 tablet (250 mg total) by mouth daily. Take first 2 tablets together, then 1 every day until finished. Patient not taking: Reported on 06/07/2020 01/29/20   Emerson Monte, FNP  benzonatate (TESSALON) 100 MG capsule Take 1 capsule (100 mg total) by mouth 3 (three) times daily as needed for cough. Patient not taking: Reported on 06/07/2020  01/29/20   Emerson Monte, FNP  cetirizine (ZYRTEC ALLERGY) 10 MG tablet Take 1 tablet (10 mg total) by mouth daily. Patient not taking: Reported on 06/07/2020 01/29/20   Emerson Monte, FNP  fesoterodine (TOVIAZ) 4 MG TB24 tablet Take 1 tablet (4 mg total) by mouth daily. Patient not taking: Reported on 06/07/2020 03/10/19   Cleon Gustin, MD  fluticasone [facility]) 50 MCG/ACT nasal spray Place 1 spray into both nostrils daily for 14 days. 01/29/20 02/12/20  Avegno, Darrelyn Hillock, FNP  nitrofurantoin, macrocrystal-monohydrate, (MACROBID) 100 MG capsule Take 1 capsule (100 mg total) by mouth 2 (two) times daily. Patient not taking: Reported on 06/07/2020 05/31/20   Stacey Drain [place], PA-C  norgestrel-ethinyl estradiol (LO/OVRAL,CRYSELLE) 0.3-30 MG-MCG tablet Take 1 tablet by mouth daily. Patient not taking: Reported on 06/07/2020    [provider]  oxybutynin (DITROPAN-XL) 10 MG 24 hr tablet Take 1 tablet (10 mg total) by mouth daily. Patient not taking: Reported on 06/07/2020 03/22/19   Cleon Gustin, MD  Vibegron (GEMTESA) 75 MG TABS Take 1 capsule by mouth daily. 06/07/20   McKenzie, Candee Furbish, MD    Family History Family History  Problem Relation Age of Onset   Hypertension Mother    Diabetes Maternal Uncle     Social History Social History   Tobacco Use   Smoking status: Never   Smokeless tobacco: Never  Vaping Use   Vaping Use: Never used  Substance Use Topics   Alcohol use:  Yes    Comment: occassionally   Drug use: No     Allergies   Codeine and Penicillins   Review of Systems Review of Systems Per HPI  Physical Exam Triage Vital Signs ED Triage Vitals  Enc Vitals Group     BP 01/13/21 1058 133/84     Pulse Rate 01/13/21 1058 87     Resp 01/13/21 1058 16     Temp 01/13/21 1058 99.2 F (37.3 C)     Temp Source 01/13/21 1058 Oral     SpO2 01/13/21 1058 96 %     Weight --      Height --      Head Circumference --      Peak Flow --      Pain  Score 01/13/21 1053 0     Pain Loc --      Pain Edu? --      Excl. in [place]? --    No data found.  Updated Vital Signs BP 133/84 (BP Location: Right Arm)    Pulse 87    Temp 99.2 F (37.3 C) (Oral)    Resp 16    SpO2 96%   Visual Acuity Right Eye Distance:   Left Eye Distance:   Bilateral Distance:    Right Eye Near:   Left Eye Near:    Bilateral Near:     Physical Exam Vitals and nursing note reviewed.  Constitutional:      Appearance: Normal appearance.  HENT:     Head: Atraumatic.     Right Ear: Tympanic membrane and external ear normal.     Left Ear: Tympanic membrane and external ear normal.     Nose: Congestion present.     Mouth/Throat:     Mouth: Mucous membranes are moist.     Pharynx: Posterior oropharyngeal erythema present.  Eyes:     Extraocular Movements: Extraocular movements intact.     Conjunctiva/sclera: Conjunctivae normal.  Cardiovascular:     Rate and Rhythm: Normal rate and regular rhythm.     Heart sounds: Normal heart sounds.  Pulmonary:     Effort: Pulmonary effort is normal.     Breath sounds: Normal breath sounds. No wheezing or rales.  Musculoskeletal:        General: Normal range of motion.     Cervical back: Normal range of motion and neck supple.  Skin:    General: Skin is warm and dry.  Neurological:     Mental Status: She is alert and oriented to person, place, and time.     Motor: No weakness.     Gait: Gait normal.  Psychiatric:        Mood and Affect: Mood normal.        Thought Content: Thought content normal.   UC Treatments / Results  Labs (all labs ordered are listed, but only abnormal results are displayed) Labs Reviewed - No data to display  EKG  Radiology No results found.  Procedures Procedures (including critical care time)  Medications Ordered in UC Medications - No data to display  Initial Impression / Assessment and Plan / UC Course  I have reviewed the triage vital signs and the nursing  notes.  Pertinent labs & imaging results that were available during my care of the patient were reviewed by me and considered in my medical decision making (see chart for details).     Vital signs and exam overall reassuring and suspicious for a viral upper respiratory infection,  possibly COVID.  She has declined viral testing today, discussed at length antibiotic stewardship and that there is no evidence of a secondary bacterial infection today and that her symptoms were very consistent with a viral upper respiratory tract infection.  We will treat symptomatically with prednisone, Phenergan DM and discussed that if she tends to get sick seasonally every year this time of year it may be a good idea to start a good allergy regimen early in that season to keep symptoms at [place].  Return for acutely worsening symptoms.  Final Clinical Impressions(s) / UC Diagnoses   Final diagnoses:  Viral URI with cough   Discharge Instructions   None    ED Prescriptions     Medication Sig Dispense Auth. Provider   predniSONE (DELTASONE) 20 MG tablet Take 2 tablets (40 mg total) by mouth daily with breakfast. 10 tablet Volney American, PA-C   promethazine-dextromethorphan (PROMETHAZINE-DM) 6.25-15 MG/5ML syrup Take 5 mLs by mouth 4 (four) times daily as needed. 100 mL Volney American, [place]      PDMP not reviewed this encounter.   Volney American, [place] 01/13/21 1117

## 2021-01-13 NOTE — ED Triage Notes (Signed)
Patient states she has had congestion since Friday with cough with grey mucus.   She states she tried Vicks Dayquil without any relief.   She states she may have been exposed at work.   Denies fever today  Denies Meds

## 2021-01-14 ENCOUNTER — Encounter: Payer: 59 | Admitting: Gastroenterology

## 2021-01-17 ENCOUNTER — Other Ambulatory Visit: Payer: Self-pay

## 2021-01-17 ENCOUNTER — Ambulatory Visit
Admission: RE | Admit: 2021-01-17 | Discharge: 2021-01-17 | Disposition: A | Discharge status: Home / Self Care | Payer: 59 | Source: Ambulatory Visit | Attending: Family Medicine | Admitting: Family Medicine

## 2021-01-17 VITALS — BP 158/90 | HR 78 | Temp 98.2°F | Resp 18

## 2021-01-17 DIAGNOSIS — J069 Acute upper respiratory infection, unspecified: Secondary | ICD-10-CM

## 2021-01-17 MED ORDER — AZITHROMYCIN 250 MG PO TABS: ORAL_TABLET | ORAL | 0 refills | Status: DC

## 2021-01-17 MED ORDER — BENZONATATE 200 MG PO CAPS: 200.0000 mg | ORAL_CAPSULE | Freq: Three times a day (TID) | ORAL | 0 refills | Status: DC | PRN

## 2021-01-17 NOTE — ED Provider Notes (Signed)
[facility]    CSN: [id number] Arrival date & time: 01/17/21  0941      History   Chief Complaint No chief complaint on file.   HPI April Marsh is a 60 y.o. female.   Patient presenting today with 1 week of ongoing intermittent fever, hacking productive cough, congestion, fatigue, scratchy throat.  Denies chest pain, shortness of breath, abdominal pain, nausea vomiting or diarrhea.  Was seen at this urgent care on Monday and tried prednisone, Phenergan DM but states it did not seem to help.  She had declined viral testing at that point.  She denies history of pulmonary disease.   Past Medical History:  Diagnosis Date   Hypertension     Patient Active Problem List   Diagnosis Date Noted   OAB (overactive bladder) 06/07/2020   Microhematuria 06/07/2020   Breast lump 05/31/2012    Past Surgical History:  Procedure Laterality Date   herniated disc      OB History   No obstetric history on file.      Home Medications    Prior to Admission medications   Medication Sig Start Date End Date Taking? Authorizing Provider  benzonatate (TESSALON) 200 MG capsule Take 1 capsule (200 mg total) by mouth 3 (three) times daily as needed for cough. 01/17/21  Yes Volney American, PA-C  azithromycin (ZITHROMAX) 250 MG tablet Take first 2 tablets together, then 1 every day until finished. 01/17/21   Volney American, PA-C  benzonatate (TESSALON) 100 MG capsule Take 1 capsule (100 mg total) by mouth 3 (three) times daily as needed for cough. Patient not taking: Reported on 06/07/2020 01/29/20   Emerson Monte, FNP  cetirizine (ZYRTEC ALLERGY) 10 MG tablet Take 1 tablet (10 mg total) by mouth daily. Patient not taking: Reported on 06/07/2020 01/29/20   Emerson Monte, FNP  fesoterodine (TOVIAZ) 4 MG TB24 tablet Take 1 tablet (4 mg total) by mouth daily. Patient not taking: Reported on 06/07/2020 03/10/19   Cleon Gustin, MD  fluticasone  [facility]) 50 MCG/ACT nasal spray Place 1 spray into both nostrils daily for 14 days. 01/29/20 02/12/20  Avegno, Darrelyn Hillock, FNP  nitrofurantoin, macrocrystal-monohydrate, (MACROBID) 100 MG capsule Take 1 capsule (100 mg total) by mouth 2 (two) times daily. Patient not taking: Reported on 06/07/2020 05/31/20   Stacey Drain [place], PA-C  norgestrel-ethinyl estradiol (LO/OVRAL,CRYSELLE) 0.3-30 MG-MCG tablet Take 1 tablet by mouth daily. Patient not taking: Reported on 06/07/2020    [provider]  oxybutynin (DITROPAN-XL) 10 MG 24 hr tablet Take 1 tablet (10 mg total) by mouth daily. Patient not taking: Reported on 06/07/2020 03/22/19   Cleon Gustin, MD  predniSONE (DELTASONE) 20 MG tablet Take 2 tablets (40 mg total) by mouth daily with breakfast. 01/13/21   Volney American, PA-C  promethazine-dextromethorphan (PROMETHAZINE-DM) 6.25-15 MG/5ML syrup Take 5 mLs by mouth 4 (four) times daily as needed. 01/13/21   Volney American, PA-C  Vibegron (GEMTESA) 75 MG TABS Take 1 capsule by mouth daily. 06/07/20   McKenzie, Candee Furbish, MD    Family History Family History  Problem Relation Age of Onset   Hypertension Mother    Diabetes Maternal Uncle     Social History Social History   Tobacco Use   Smoking status: Never   Smokeless tobacco: Never  Vaping Use   Vaping Use: Never used  Substance Use Topics   Alcohol use: Yes    Comment: occassionally   Drug use: No  Allergies   Codeine and Penicillins   Review of Systems Review of Systems Per HPI  Physical Exam Triage Vital Signs ED Triage Vitals  Enc Vitals Group     BP 01/17/21 1005 (!) 158/90     Pulse Rate 01/17/21 1005 78     Resp 01/17/21 1005 18     Temp 01/17/21 1005 98.2 F (36.8 C)     Temp Source 01/17/21 1005 Oral     SpO2 01/17/21 1005 99 %     Weight --      Height --      Head Circumference --      Peak Flow --      Pain Score 01/17/21 1006 4     Pain Loc --      Pain Edu? --      Excl.  in [place]? --    No data found.  Updated Vital Signs BP (!) 158/90 (BP Location: Right Arm)    Pulse 78    Temp 98.2 F (36.8 C) (Oral)    Resp 18    SpO2 99%   Visual Acuity Right Eye Distance:   Left Eye Distance:   Bilateral Distance:    Right Eye Near:   Left Eye Near:    Bilateral Near:     Physical Exam Vitals and nursing note reviewed.  Constitutional:      Appearance: Normal appearance.  HENT:     Head: Atraumatic.     Right Ear: Tympanic membrane and external ear normal.     Left Ear: Tympanic membrane and external ear normal.     Nose: Rhinorrhea present.     Mouth/Throat:     Mouth: Mucous membranes are moist.     Pharynx: Posterior oropharyngeal erythema present.  Eyes:     Extraocular Movements: Extraocular movements intact.     Conjunctiva/sclera: Conjunctivae normal.  Cardiovascular:     Rate and Rhythm: Normal rate and regular rhythm.     Heart sounds: Normal heart sounds.  Pulmonary:     Effort: Pulmonary effort is normal.     Breath sounds: Normal breath sounds. No wheezing or rales.  Abdominal:     General: Bowel sounds are normal. There is no distension.     Tenderness: There is no abdominal tenderness. There is no guarding.  Musculoskeletal:        General: Normal range of motion.     Cervical back: Normal range of motion and neck supple.  Skin:    General: Skin is warm and dry.  Neurological:     Mental Status: She is alert and oriented to person, place, and time.  Psychiatric:        Mood and Affect: Mood normal.        Thought Content: Thought content normal.     UC Treatments / Results  Labs (all labs ordered are listed, but only abnormal results are displayed) Labs Reviewed - No data to display  EKG   Radiology No results found.  Procedures Procedures (including critical care time)  Medications Ordered in UC Medications - No data to display  Initial Impression / Assessment and Plan / UC Course  I have reviewed the triage  vital signs and the nursing notes.  Pertinent labs & imaging results that were available during my care of the patient were reviewed by me and considered in my medical decision making (see chart for details).     Vital clinical exam overall reassuring, suspect still inflammatory/viral illness  with no evidence of secondary bacterial infection however given persistence of symptoms and duration will send azithromycin in case not improving over the next few days and Tessalon Perles as she states she does better on these than the cough syrup.  Return precautions reviewed.  Final Clinical Impressions(s) / UC Diagnoses   Final diagnoses:  Upper respiratory tract infection, unspecified type   Discharge Instructions   None    ED Prescriptions     Medication Sig Dispense Auth. Provider   azithromycin (ZITHROMAX) 250 MG tablet Take first 2 tablets together, then 1 every day until finished. 6 tablet Volney American, [place]   benzonatate (TESSALON) 200 MG capsule Take 1 capsule (200 mg total) by mouth 3 (three) times daily as needed for cough. 20 capsule Volney American, [place]      PDMP not reviewed this encounter.   Volney American, [place] 01/17/21 1039

## 2021-01-17 NOTE — ED Triage Notes (Signed)
Was seen on Monday.  States she doesn't feel any better.  Productive cough- grayish in color.  Was given prednisone and cough medication.  States cough medication is not working for her.  States she has been having fever off and on.

## 2021-02-27 ENCOUNTER — Other Ambulatory Visit: Payer: Self-pay

## 2021-02-27 ENCOUNTER — Ambulatory Visit (AMBULATORY_SURGERY_CENTER): Payer: 59

## 2021-02-27 VITALS — Ht 64.0 in | Wt 140.0 lb

## 2021-02-27 DIAGNOSIS — Z1211 Encounter for screening for malignant neoplasm of colon: Secondary | ICD-10-CM

## 2021-02-27 MED ORDER — NA SULFATE-K SULFATE-MG SULF 17.5-3.13-1.6 GM/177ML PO SOLN: 1.0000 | Freq: Once | ORAL | 0 refills | Status: AC

## 2021-02-27 NOTE — Progress Notes (Signed)
No egg or soy allergy known to patient  No issues known to pt with past sedation with any surgeries or procedures Patient denies ever being told they had issues or difficulty with intubation  No FH of Malignant Hyperthermia Pt is not on diet pills Pt is not on home 02  Pt is not on blood thinners  Pt denies issues with constipation;  No A fib or A flutter Pt is fully vaccinated for Covid x 2; NO PA's for preps discussed with pt in PV today  Discussed with pt there will be an out-of-pocket cost for prep and that varies from $0 to 70 + dollars - pt verbalized understanding  Due to the COVID-19 pandemic we are asking patients to follow certain guidelines in PV and the [facility]   Pt aware of COVID protocols and [facility] guidelines  PV completed over the phone. Pt verified name, DOB, address and insurance during PV today.  Pt mailed instruction packet with copy of consent form to read and not return, and instructions.  Pt encouraged to call with questions or issues.  If pt has My chart, procedure instructions sent via My Chart

## 2021-03-11 ENCOUNTER — Encounter: Payer: Self-pay | Admitting: Gastroenterology

## 2021-03-12 ENCOUNTER — Ambulatory Visit (AMBULATORY_SURGERY_CENTER): Payer: 59 | Admitting: Gastroenterology

## 2021-03-12 ENCOUNTER — Encounter: Payer: Self-pay | Admitting: Gastroenterology

## 2021-03-12 ENCOUNTER — Other Ambulatory Visit: Payer: Self-pay

## 2021-03-12 VITALS — BP 103/76 | HR 74 | Temp 98.6°F | Resp 11 | Ht 64.0 in | Wt 140.0 lb

## 2021-03-12 DIAGNOSIS — D123 Benign neoplasm of transverse colon: Secondary | ICD-10-CM | POA: Diagnosis not present

## 2021-03-12 DIAGNOSIS — D122 Benign neoplasm of ascending colon: Secondary | ICD-10-CM

## 2021-03-12 DIAGNOSIS — D128 Benign neoplasm of rectum: Secondary | ICD-10-CM | POA: Diagnosis not present

## 2021-03-12 DIAGNOSIS — D125 Benign neoplasm of sigmoid colon: Secondary | ICD-10-CM | POA: Diagnosis not present

## 2021-03-12 DIAGNOSIS — Z1211 Encounter for screening for malignant neoplasm of colon: Secondary | ICD-10-CM

## 2021-03-12 MED ORDER — SODIUM CHLORIDE 0.9 % IV SOLN: 500.0000 mL | Freq: Once | INTRAVENOUS | Status: DC

## 2021-03-12 NOTE — Progress Notes (Signed)
[facility] History and Physical   Primary Care Physician:  Pcp, No   Reason for Procedure:   Colon cancer screening  Plan:    Screening colonoscopy     HPI: April Marsh is a 61 y.o. female undergoing initial average risk screening colonoscopy.  She has no family history of colon cancer and no chronic GI symptoms.    Past Medical History:  Diagnosis Date   Hypertension    being monitored by PCP   PONV (postoperative nausea and vomiting)    Seasonal allergies     Past Surgical History:  Procedure Laterality Date   CERVICAL SPINE SURGERY  2007   due to herniated disc and pinched nerve   WISDOM TOOTH EXTRACTION      Prior to Admission medications   Medication Sig Start Date End Date Taking? Authorizing Provider  hydrocortisone-pramoxine [facility]) 2.5-1 % rectal cream APPLY A THIN FILM TO THESAFFECTED AREA 3 TO 4 TIMES DAILY. 02/25/21  Yes [provider]    Current Outpatient Medications  Medication Sig Dispense Refill   hydrocortisone-pramoxine (ANALPRAM-HC) 2.5-1 % rectal cream APPLY A THIN FILM TO THESAFFECTED AREA 3 TO 4 TIMES DAILY.     Current Facility-Administered Medications  Medication Dose Route Frequency Provider Last Rate Last Admin   0.9 %  sodium chloride infusion  500 mL Intravenous Once Daryel November, MD        Allergies as of 03/12/2021 - Review Complete 03/12/2021  Allergen Reaction Noted   Codeine Other (See Comments) 05/31/2012   Penicillins Rash 05/31/2012    Family History  Problem Relation Age of Onset   Colon polyps Mother    Hypertension Mother    Diabetes Maternal Uncle    Colon cancer Neg Hx    Esophageal cancer Neg Hx    Rectal cancer Neg Hx    Stomach cancer Neg Hx     Social History   Socioeconomic History   Marital status: Single    Spouse name: Not on file   Number of children: Not on file   Years of education: Not on file   Highest education level: Not on file  Occupational  History   Not on file  Tobacco Use   Smoking status: Never   Smokeless tobacco: Never  Vaping Use   Vaping Use: Never used  Substance and Sexual Activity   Alcohol use: Not Currently    Alcohol/week: 0.0 - 1.0 standard drinks    Comment: occassionally   Drug use: No   Sexual activity: Not Currently  Other Topics Concern   Not on file  Social History Narrative   ** Merged History Encounter **       Social Determinants of Health   Financial Resource Strain: Not on file  Food Insecurity: Not on file  Transportation Needs: Not on file  Physical Activity: Not on file  Stress: Not on file  Social Connections: Not on file  Intimate Partner Violence: Not on file    Review of Systems:  All other review of systems negative except as mentioned in the HPI.  Physical Exam: Vital signs BP (!) 149/97    Pulse 80    Temp 98.6 F (37 C)    Ht 5\' 4"  (1.626 m)    Wt 140 lb (63.5 kg)    SpO2 99%    BMI 24.03 kg/m   General:   Alert,  Well-developed, well-nourished, pleasant and cooperative in NAD Airway:  Mallampati 2 Lungs:  Clear throughout to  auscultation.   Heart:  Regular rate and rhythm; no murmurs, clicks, rubs,  or gallops. Abdomen:  Soft, nontender and nondistended. Normal bowel sounds.   Neuro/Psych:  Normal mood and affect. A and O x 3   Malaiyah Achorn E. Candis Schatz, MD [facility]

## 2021-03-12 NOTE — Op Note (Signed)
[facility] Patient Name: April Marsh Procedure Date: 03/12/2021 8:49 AM MRN: [id number] Endoscopist: Nicki Reaper E. Candis Schatz , MD Age: 61 Referring MD:  Date of Birth: June 29, 1960 Gender: Female Account #: [id number] Procedure:                Colonoscopy Indications:              Screening for colorectal malignant neoplasm, This                            is the patient's first colonoscopy Medicines:                Monitored Anesthesia Care Procedure:                Pre-Anesthesia Assessment:                           - Prior to the procedure, a History and Physical                            was performed, and patient medications and                            allergies were reviewed. The patient's tolerance of                            previous anesthesia was also reviewed. The risks                            and benefits of the procedure and the sedation                            options and risks were discussed with the patient.                            All questions were answered, and informed consent                            was obtained. Prior Anticoagulants: The patient has                            taken no previous anticoagulant or antiplatelet                            agents. ASA Grade Assessment: II - A patient with                            mild systemic disease. After reviewing the risks                            and benefits, the patient was deemed in                            satisfactory condition to undergo the procedure.  After obtaining informed consent, the colonoscope                            was passed under direct vision. Throughout the                            procedure, the patient's blood pressure, pulse, and                            oxygen saturations were monitored continuously. The                            Olympus PCF-H190DL (#[id number]) Colonoscope was                            introduced through the  anus and advanced to the the                            terminal ileum, with identification of the                            appendiceal orifice and IC valve. The colonoscopy                            was performed without difficulty. The patient                            tolerated the procedure well. The quality of the                            bowel preparation was good. The terminal ileum,                            ileocecal valve, appendiceal orifice, and rectum                            were photographed. Scope In: 8:58:17 AM Scope Out: 9:29:03 AM Scope Withdrawal Time: 0 hours 25 minutes 53 seconds  Total Procedure Duration: 0 hours 30 minutes 46 seconds  Findings:                 The perianal and digital rectal examinations were                            normal. Pertinent negatives include normal                            sphincter tone and no palpable rectal lesions.                           A 8 mm polyp was found in the splenic flexure. The                            polyp was sessile. The polyp was removed with a  cold snare. Resection and retrieval were complete.                            Estimated blood loss was minimal.                           A 12 mm polyp was found in the ascending colon. The                            polyp was sessile. The polyp was removed piecemeal                            with a cold snare. Resection and retrieval were                            complete. Estimated blood loss was minimal.                           A 4 mm polyp was found in the ascending colon. The                            polyp was sessile. The polyp was removed with a                            cold snare. Resection and retrieval were complete.                            Estimated blood loss was minimal.                           A 4 mm polyp was found in the transverse colon. The                            polyp was sessile. The polyp was  removed with a                            cold snare. Resection and retrieval were complete.                            Estimated blood loss was minimal.                           A 10 mm polyp was found in the sigmoid colon. The                            polyp was pedunculated. The polyp was removed with                            a cold snare. Resection and retrieval were                            complete. Estimated blood loss was minimal. To  prevent bleeding after the polypectomy, one                            hemostatic clip was successfully placed (MR                            conditional). There was no bleeding at the end of                            the procedure.                           Many sessile polyps were found in the rectum. The                            polyps were diminutive in size. Two of these polyps                            were removed with a cold snare. Resection and                            retrieval were complete. Estimated blood loss was                            minimal.                           The exam was otherwise normal throughout the                            examined colon.                           The terminal ileum appeared normal.                           Non-bleeding internal hemorrhoids were found during                            retroflexion. The hemorrhoids were Grade I                            (internal hemorrhoids that do not prolapse).                           No additional abnormalities were found on                            retroflexion. Complications:            No immediate complications. Estimated Blood Loss:     Estimated blood loss was minimal. Impression:               - One 8 mm polyp at the splenic flexure, removed  with a cold snare. Resected and retrieved.                           - One 12 mm polyp in the ascending colon, removed                             piecemeal with a cold snare. Resected and retrieved.                           - One 4 mm polyp in the ascending colon, removed                            with a cold snare. Resected and retrieved.                           - One 4 mm polyp in the transverse colon, removed                            with a cold snare. Resected and retrieved.                           - One 10 mm polyp in the sigmoid colon, removed                            with a cold snare. Resected and retrieved. Clip (MR                            conditional) was placed.                           - Many diminutive polyps in the rectum, removed                            with a cold snare. Two were resected and retrieved.                            These appeared consistent with hyperplastic polyps.                           - The examined portion of the ileum was normal.                           - Non-bleeding internal hemorrhoids. Recommendation:           - Patient has a contact number available for                            emergencies. The signs and symptoms of potential                            delayed complications were discussed with the  patient. Return to normal activities tomorrow.                            Written discharge instructions were provided to the                            patient.                           - Resume previous diet.                           - Continue present medications.                           - Await pathology results.                           - Repeat colonoscopy (date not yet determined) for                            surveillance based on pathology results. Jahmia Berrett E. Candis Schatz, MD 03/12/2021 9:38:57 AM This report has been signed electronically.

## 2021-03-12 NOTE — Progress Notes (Signed)
Pt's states no medical or surgical changes since previsit or office visit. VS by CW. 

## 2021-03-12 NOTE — Progress Notes (Signed)
Went over info PZ:PSUGA clip in sigmoid colon x1.  Clip card was also given to pt's mother with pt's AVS and asked pt to place in her wallet.  No problems noted in the recovery room. maw

## 2021-03-12 NOTE — Patient Instructions (Addendum)
Handouts were given to your care partner on polyps and hemorrhoids. You may resume your current medications today. Await biopsy results.  May take 1-3 weeks to receive pathology results. Please call if any questions or concerns.     YOU HAD AN ENDOSCOPIC PROCEDURE TODAY AT [facility]:   Refer to the procedure report that was given to you for any specific questions about what was found during the examination.  If the procedure report does not answer your questions, please call your gastroenterologist to clarify.  If you requested that your care partner not be given the details of your procedure findings, then the procedure report has been included in a sealed envelope for you to review at your convenience later.  YOU SHOULD EXPECT: Some feelings of bloating in the abdomen. Passage of more gas than usual.  Walking can help get rid of the air that was put into your GI tract during the procedure and reduce the bloating. If you had a lower endoscopy (such as a colonoscopy or flexible sigmoidoscopy) you may notice spotting of blood in your stool or on the toilet paper. If you underwent a bowel prep for your procedure, you may not have a normal bowel movement for a few days.  Please Note:  You might notice some irritation and congestion in your nose or some drainage.  This is from the oxygen used during your procedure.  There is no need for concern and it should clear up in a day or so.  SYMPTOMS TO REPORT IMMEDIATELY:  Following lower endoscopy (colonoscopy or flexible sigmoidoscopy):  Excessive amounts of blood in the stool  Significant tenderness or worsening of abdominal pains  Swelling of the abdomen that is new, acute  Fever of 100F or higher   For urgent or emergent issues, a gastroenterologist can be reached at any hour by calling [phone]. Do not use MyChart messaging for urgent concerns.    DIET:  We do recommend a small meal at first, but then you may proceed to  your regular diet.  Drink plenty of fluids but you should avoid alcoholic beverages for 24 hours.  ACTIVITY:  You should plan to take it easy for the rest of today and you should NOT DRIVE or use heavy machinery until tomorrow (because of the sedation medicines used during the test).    FOLLOW UP: Our staff will call the number listed on your records 48-72 hours following your procedure to check on you and address any questions or concerns that you may have regarding the information given to you following your procedure. If we do not reach you, we will leave a message.  We will attempt to reach you two times.  During this call, we will ask if you have developed any symptoms of COVID 19. If you develop any symptoms (ie: fever, flu-like symptoms, shortness of breath, cough etc.) before then, please call [phone].  If you test positive for Covid 19 in the 2 weeks post procedure, please call and report this information to [place].    If any biopsies were taken you will be contacted by phone or by letter within the next 1-3 weeks.  Please call us at [phone] if you have not heard about the biopsies in 3 weeks.    SIGNATURES/CONFIDENTIALITY: You and/or your care partner have signed paperwork which will be entered into your electronic medical record.  These signatures attest to the fact that that the information above on your After Visit Summary  has been reviewed and is understood.  Full responsibility of the confidentiality of this discharge information lies with you and/or your care-partner.

## 2021-03-12 NOTE — Progress Notes (Signed)
Called to room to assist during endoscopic procedure.  Patient ID and intended procedure confirmed with present staff. Received instructions for my participation in the procedure from the performing physician.  

## 2021-03-12 NOTE — Progress Notes (Signed)
PT taken to PACU. Monitors in place. VSS. Report given to RN. 

## 2021-03-13 ENCOUNTER — Telehealth: Payer: Self-pay | Admitting: Gastroenterology

## 2021-03-13 NOTE — Telephone Encounter (Signed)
Inbound call from patient states that she had a colonoscopy done yesterday and that she was looking over her paperwork and it says there were 7 polyps removed. Patient is seeking advice on the polyps. Please advise.

## 2021-03-13 NOTE — Telephone Encounter (Signed)
Spoke with pt and reviewed colon report with her over the phone. Let pt know we will let her know when the path results are back from the procedure.

## 2021-03-14 ENCOUNTER — Telehealth: Payer: Self-pay | Admitting: *Deleted

## 2021-03-14 NOTE — Telephone Encounter (Signed)
No answer on second attempt follow up call.

## 2021-03-14 NOTE — Telephone Encounter (Signed)
°  Follow up Call-  Call back number 03/12/2021  Post procedure Call Back phone  # [phone]  Permission to leave phone message Yes  Some recent data might be hidden    No answer, no machine to leave message. Will try again later.

## 2021-03-17 NOTE — Progress Notes (Signed)
April Marsh,   The polyps that I removed during your recent procedure were completely benign but were proven to be "pre-cancerous" polyps that MAY have grown into cancers if they had not been removed.  Studies shows that at least 20% of women over age 60 and 30% of men over age 59 have pre-cancerous polyps.  Based on current nationally recognized surveillance guidelines, I recommend that you have a repeat colonoscopy in 3 years.   If you develop any new rectal bleeding, abdominal pain or significant bowel habit changes, please contact me before then.

## 2021-10-13 ENCOUNTER — Ambulatory Visit: Payer: 59 | Admitting: Urology

## 2021-11-01 ENCOUNTER — Ambulatory Visit (INDEPENDENT_AMBULATORY_CARE_PROVIDER_SITE_OTHER): Payer: 59

## 2021-11-01 ENCOUNTER — Ambulatory Visit
Admission: RE | Admit: 2021-11-01 | Discharge: 2021-11-01 | Disposition: A | Discharge status: Home / Self Care | Payer: 59 | Source: Ambulatory Visit | Attending: Urgent Care | Admitting: Urgent Care

## 2021-11-01 VITALS — BP 157/89 | HR 68 | Temp 98.0°F | Resp 16

## 2021-11-01 DIAGNOSIS — M1712 Unilateral primary osteoarthritis, left knee: Secondary | ICD-10-CM | POA: Diagnosis not present

## 2021-11-01 DIAGNOSIS — M25562 Pain in left knee: Secondary | ICD-10-CM | POA: Diagnosis not present

## 2021-11-01 DIAGNOSIS — M25462 Effusion, left knee: Secondary | ICD-10-CM

## 2021-11-01 MED ORDER — PREDNISONE 20 MG PO TABS: 40.0000 mg | ORAL_TABLET | Freq: Every day | ORAL | 0 refills | Status: DC

## 2021-11-01 NOTE — ED Provider Notes (Addendum)
[facility]  Note:  This document was prepared using Dragon voice recognition software and may include unintentional dictation errors.  MRN: [id number] DOB: 13-Apr-1960  Subjective:   April Marsh is a 61 y.o. female presenting for 2 to 3-week history of persistent intermittent left knee pain with swelling.  Patient has had issues with her knee for years now.  She was told previously that she had arthritis and bone spurs.  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  No fall, trauma.  No knee buckling.  Has been icing but sometimes that causes pain as well.  No current facility-administered medications for this encounter.  Current Outpatient Medications:    hydrocortisone-pramoxine (ANALPRAM-HC) 2.5-1 % rectal cream, APPLY A THIN FILM TO THESAFFECTED AREA 3 TO 4 TIMES DAILY., Disp: , Rfl:    Allergies  Allergen Reactions   Codeine Other (See Comments)    "Too strong of a medicine for me"   Penicillins Rash    Past Medical History:  Diagnosis Date   Hypertension    being monitored by PCP   PONV (postoperative nausea and vomiting)    Seasonal allergies      Past Surgical History:  Procedure Laterality Date   CERVICAL SPINE SURGERY  2007   due to herniated disc and pinched nerve   WISDOM TOOTH EXTRACTION      Family History  Problem Relation Age of Onset   Colon polyps Mother    Hypertension Mother    Diabetes Maternal Uncle    Colon cancer Neg Hx    Esophageal cancer Neg Hx    Rectal cancer Neg Hx    Stomach cancer Neg Hx     Social History   Tobacco Use   Smoking status: Never   Smokeless tobacco: Never  Vaping Use   Vaping Use: Never used  Substance Use Topics   Alcohol use: Not Currently    Alcohol/week: 0.0 - 1.0 standard drinks of alcohol    Comment: occassionally   Drug use: No    ROS   Objective:   Vitals: BP (!) 157/89 (BP Location: Right  Arm)   Pulse 68   Temp 98 F (36.7 C) (Oral)   Resp 16   SpO2 98%   Physical Exam Constitutional:      General: She is not in acute distress.    Appearance: Normal appearance. She is well-developed. She is not ill-appearing, toxic-appearing or diaphoretic.  HENT:     Head: Normocephalic and atraumatic.     Nose: Nose normal.     Mouth/Throat:     Mouth: Mucous membranes are moist.  Eyes:     General: No scleral icterus.       Right eye: No discharge.        Left eye: No discharge.     Extraocular Movements: Extraocular movements intact.  Cardiovascular:     Rate and Rhythm: Normal rate.  Pulmonary:     Effort: Pulmonary effort is normal.  Musculoskeletal:     Left knee: Swelling and bony tenderness present. No deformity, effusion, erythema, ecchymosis, lacerations or crepitus. Normal range of motion. Tenderness present over the medial joint line and patellar tendon. No lateral joint line tenderness. Normal alignment and normal patellar mobility.  Skin:    General: Skin is warm and dry.  Neurological:     General: No focal deficit present.     Mental Status: She is  alert and oriented to person, place, and time.  Psychiatric:        Mood and Affect: Mood normal.        Behavior: Behavior normal.    I applied a 4 inch Ace wrap to the left knee.   Assessment and Plan :   PDMP not reviewed this encounter.  1. Acute pain of left knee   2. Pain and swelling of left knee     X-ray over-read was pending at time of discharge, recommended follow up with only abnormal results. Otherwise will not call for negative over-read. Patient was in agreement. Suspect inflammatory pain, likely arthritis secondary to the nature of her work.  Recommended RICE method.  Offered her an oral prednisone course. Counseled patient on potential for adverse effects with medications prescribed/recommended today, ER and return-to-clinic precautions discussed, patient verbalized understanding.    Jaynee Eagles, [place] 11/01/21 1032  DG Knee Complete 4 Views Left  Result Date: 11/01/2021 CLINICAL DATA:  61 year old female with left knee pain and swelling for 2 weeks. EXAM: LEFT KNEE - COMPLETE 4+ VIEW COMPARISON:  None Available. FINDINGS: No evidence of joint effusion on the lateral view. Diffuse osteopenia appears advanced for age. Patellofemoral compartment joint space loss and degenerative spurring appears moderate. Mild medial compartment joint space loss and degenerative spurring. No acute osseous abnormality identified. IMPRESSION: 1. Osteopenia appears advanced for age. Query predisposing factors, including metabolic diseases. 2. Moderate patellofemoral compartment and mild medial compartment degenerative changes. 3. No joint effusion or acute osseous abnormality identified. Electronically Signed   By: Genevie Ann M.D.   On: 11/01/2021 10:40    No change in treatment plan.  Patient is to follow-up with an orthopedist.  Also recommended that she try and establish care with a PCP especially for her preventative and health maintenance measures such as screening for osteoporosis.  She has done this with her gynecologist but still recommended that she establish care with a PCP.   Jaynee Eagles, PA-C 11/01/21 1138

## 2021-11-01 NOTE — ED Triage Notes (Signed)
Left knee pain and some swelling x 2 to 3 weeks.  No injury.  Hx of pain in that knee.  Has been taking Advil for pain without relief.

## 2021-11-06 ENCOUNTER — Encounter: Payer: Self-pay | Admitting: Orthopedic Surgery

## 2021-11-06 ENCOUNTER — Ambulatory Visit (INDEPENDENT_AMBULATORY_CARE_PROVIDER_SITE_OTHER): Payer: 59 | Admitting: Orthopedic Surgery

## 2021-11-06 VITALS — BP 142/81 | HR 76 | Ht 64.0 in | Wt 140.0 lb

## 2021-11-06 DIAGNOSIS — D239 Other benign neoplasm of skin, unspecified: Secondary | ICD-10-CM | POA: Insufficient documentation

## 2021-11-06 DIAGNOSIS — M1710 Unilateral primary osteoarthritis, unspecified knee: Secondary | ICD-10-CM

## 2021-11-06 DIAGNOSIS — M25562 Pain in left knee: Secondary | ICD-10-CM

## 2021-11-06 DIAGNOSIS — M1712 Unilateral primary osteoarthritis, left knee: Secondary | ICD-10-CM

## 2021-11-06 MED ORDER — MELOXICAM 7.5 MG PO TABS: 7.5000 mg | ORAL_TABLET | Freq: Every day | ORAL | 5 refills | Status: DC

## 2021-11-06 MED ORDER — MELOXICAM 7.5 MG PO TABS: 7.5000 mg | ORAL_TABLET | Freq: Two times a day (BID) | ORAL | 5 refills | Status: DC

## 2021-11-06 NOTE — Patient Instructions (Signed)
Place knee pain patient instructions here.

## 2021-11-06 NOTE — Progress Notes (Signed)
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  She had an evaluation 20 years ago and was told she had anterior knee pain syndrome was treated with physical therapy and exercise  She was doing well until about 3 weeks ago when she noticed increased pain.  She went to urgent care after 2 weeks of anterior knee pain.  She took prednisone and has improved with still some residual symptoms which include pain in the front of the knee pain on the side and back of the knee  This is worse with standing  No catching locking giving way or mechanical symptoms  Review of systems reveals no numbness or tingling no evidence of rash or erythema  Past Medical History:  Diagnosis Date   Hypertension    being monitored by PCP   PONV (postoperative nausea and vomiting)    Seasonal allergies     BP (!) 142/81   Pulse 76   Ht '5\' 4"'$  (1.626 m)   Wt 140 lb (63.5 kg)   BMI 24.03 kg/m    General appearance: Well-developed well-nourished no gross deformities  Cardiovascular normal pulse and perfusion normal color without edema  Neurologically no sensation loss or deficits or pathologic reflexes  Psychological: Awake alert and oriented x3 mood and affect normal  Skin no lacerations or ulcerations no nodularity no palpable masses, no erythema or nodularity  Musculoskeletal:  GAIT NORMAL  NO SWELLING IN THE LEFT KNEE AND SHE HAS FROM  MILD CREPTATANCE IN THE PTF JOINT   Imaging I READ THE IMAGE FROM URGENT CARE   SHE HAS MILD GRADE 2 OA OF THE LEFT KNEE   A/P  Encounter Diagnoses  Name Primary?   Primary localized osteoarthritis of knee LEFT  Yes   Anterior knee pain, left    Primary osteoarthritis of left knee    ICE AS NEEDED  TYLENOL ARTHRITIS FOR PAIN  MELOXICAM AS NEEDED FOR SWELLING  PX EDUCATION  PRN FU

## 2022-01-26 ENCOUNTER — Ambulatory Visit
Admission: EM | Admit: 2022-01-26 | Discharge: 2022-01-26 | Disposition: A | Discharge status: Home / Self Care | Payer: 59 | Attending: Internal Medicine | Admitting: Internal Medicine

## 2022-01-26 DIAGNOSIS — J029 Acute pharyngitis, unspecified: Secondary | ICD-10-CM | POA: Diagnosis not present

## 2022-01-26 DIAGNOSIS — Z1152 Encounter for screening for COVID-19: Secondary | ICD-10-CM | POA: Insufficient documentation

## 2022-01-26 LAB — POCT RAPID STREP A (OFFICE): Rapid Strep A Screen: NEGATIVE

## 2022-01-26 MED ORDER — LIDOCAINE VISCOUS HCL 2 % MT SOLN: 15.0000 mL | OROMUCOSAL | 0 refills | Status: AC | PRN

## 2022-01-26 NOTE — ED Triage Notes (Signed)
Pt reports it hurts to swallow x 2 days.

## 2022-01-26 NOTE — ED Provider Notes (Signed)
[facility]    CSN: [id number] Arrival date & time: 01/26/22  1141      History   Chief Complaint No chief complaint on file.   HPI April Marsh is a 62 y.o. female.   Patient presents today for 2-day history of bodyaches, chills, postnasal drainage, sore throat, swollen glands, right ear pain, and fatigue.  She denies known fevers, cough, shortness of breath or chest pain, nasal congestion, runny nose, sneezing, headache, abdominal pain, nausea/vomiting, diarrhea, loss of taste/smell, and known sick contacts.  Has not take anything for symptoms so far.  Reports history of strep years ago.  Patient denies chest pain, shortness of breath, lightheadedness/dizziness, lower extremity swelling, and vision changes.  It appears she has a history of hypertensive disorder and she is hypertensive today in urgent care.  She attributes this to long wait time.    Past Medical History:  Diagnosis Date   Hypertension    being monitored by PCP   PONV (postoperative nausea and vomiting)    Seasonal allergies     Patient Active Problem List   Diagnosis Date Noted   Benign neoplasm of skin 11/06/2021   Osteopenia 12/20/2020   Hypertensive disorder 12/20/2020   OAB (overactive bladder) 06/07/2020   Microhematuria 06/07/2020   Breast lump 05/31/2012    Past Surgical History:  Procedure Laterality Date   CERVICAL SPINE SURGERY  2007   due to herniated disc and pinched nerve   WISDOM TOOTH EXTRACTION      OB History   No obstetric history on file.      Home Medications    Prior to Admission medications   Medication Sig Start Date End Date Taking? Authorizing Provider  lidocaine (XYLOCAINE) 2 % solution Use as directed 15 mLs in the mouth or throat every 3 (three) hours as needed for mouth pain. 01/26/22  Yes Eulogio Bear, NP  acetaminophen (TYLENOL) 650 MG CR tablet Take 1,300 mg by mouth every 8 (eight) hours as needed for pain.    [provider]  hydrocortisone-pramoxine [facility]) 2.5-1 % rectal cream APPLY A THIN FILM TO THESAFFECTED AREA 3 TO 4 TIMES DAILY. Patient not taking: Reported on 11/06/2021 02/25/21   [provider]  meloxicam (MOBIC) 7.5 MG tablet Take 1 tablet (7.5 mg total) by mouth 2 (two) times daily. 11/06/21   Carole Civil, MD    Family History Family History  Problem Relation Age of Onset   Colon polyps Mother    Hypertension Mother    Diabetes Maternal Uncle    Colon cancer Neg Hx    Esophageal cancer Neg Hx    Rectal cancer Neg Hx    Stomach cancer Neg Hx     Social History Social History   Tobacco Use   Smoking status: Never   Smokeless tobacco: Never  Vaping Use   Vaping Use: Never used  Substance Use Topics   Alcohol use: Not Currently    Alcohol/week: 0.0 - 1.0 standard drinks of alcohol    Comment: occassionally   Drug use: No     Allergies   Codeine and Penicillins   Review of Systems Review of Systems Per HPI  Physical Exam Triage Vital Signs ED Triage Vitals  Enc Vitals Group     BP 01/26/22 1453 (!) 177/92     Pulse Rate 01/26/22 1453 72     Resp 01/26/22 1453 20     Temp 01/26/22 1453 98.2 F (36.8 C)  Temp Source 01/26/22 1453 Oral     SpO2 01/26/22 1453 95 %     Weight --      Height --      Head Circumference --      Peak Flow --      Pain Score 01/26/22 1454 6     Pain Loc --      Pain Edu? --      Excl. in [place]? --    No data found.  Updated Vital Signs BP (!) 177/92 (BP Location: Right Arm)   Pulse 72   Temp 98.2 F (36.8 C) (Oral)   Resp 20   SpO2 95%   Visual Acuity Right Eye Distance:   Left Eye Distance:   Bilateral Distance:    Right Eye Near:   Left Eye Near:    Bilateral Near:     Physical Exam Vitals and nursing note reviewed.  Constitutional:      General: She is not in acute distress.    Appearance: She is well-developed. She is not toxic-appearing.  HENT:     Head: Normocephalic and atraumatic.      Right Ear: Tympanic membrane, ear canal and external ear normal. No drainage or swelling. No middle ear effusion. Tympanic membrane is not erythematous.     Left Ear: Tympanic membrane, ear canal and external ear normal. No drainage, swelling or tenderness.  No middle ear effusion. Tympanic membrane is not erythematous.     Nose: No congestion or rhinorrhea.     Mouth/Throat:     Mouth: Mucous membranes are moist.     Pharynx: Oropharynx is clear. Uvula midline. Posterior oropharyngeal erythema present. No oropharyngeal exudate.     Tonsils: Tonsillar exudate present. 2+ on the right. 2+ on the left.  Eyes:     General:        Right eye: No discharge.        Left eye: No discharge.     Extraocular Movements: Extraocular movements intact.     Right eye: Normal extraocular motion.     Left eye: Normal extraocular motion.  Cardiovascular:     Rate and Rhythm: Normal rate and regular rhythm.  Pulmonary:     Effort: Pulmonary effort is normal. No respiratory distress.     Breath sounds: Normal breath sounds. No wheezing, rhonchi or rales.  Abdominal:     General: Abdomen is flat. Bowel sounds are normal. There is no distension.     Palpations: Abdomen is soft.     Tenderness: There is no abdominal tenderness. There is no guarding.  Musculoskeletal:     Cervical back: Normal range of motion and neck supple.  Lymphadenopathy:     Cervical: Cervical adenopathy present.  Skin:    General: Skin is warm and dry.     Capillary Refill: Capillary refill takes less than 2 seconds.     Coloration: Skin is not pale.     Findings: No erythema or rash.  Neurological:     Mental Status: She is alert and oriented to person, place, and time.  Psychiatric:        Behavior: Behavior is cooperative.      UC Treatments / Results  Labs (all labs ordered are listed, but only abnormal results are displayed) Labs Reviewed  SARS CORONAVIRUS 2 (TAT 6-24 HRS)  CULTURE, GROUP A STREP [facility])  POCT RAPID  STREP A (OFFICE)    EKG   Radiology No results found.  Procedures Procedures (including critical  care time)  Medications Ordered in UC Medications - No data to display  Initial Impression / Assessment and Plan / UC Course  I have reviewed the triage vital signs and the nursing notes.  Pertinent labs & imaging results that were available during my care of the patient were reviewed by me and considered in my medical decision making (see chart for details).   Patient is well-appearing, afebrile, not tachycardic, not tachypneic, oxygenating well on room air.  Patient is hypertensive today in urgent care.  She attributes this to long wait time today.  I apologize for the long wait time.  Acute pharyngitis, unspecified etiology Encounter for screening for COVID-19 Suspect viral etiology Rapid strep throat test negative, throat culture pending COVID-19 pending Supportive care discussed Start lidocaine rinses ER, return precautions discussed with patient Note given for work  The patient was given the opportunity to ask questions.  All questions answered to their satisfaction.  The patient is in agreement to this plan.    Final Clinical Impressions(s) / UC Diagnoses   Final diagnoses:  Acute pharyngitis, unspecified etiology  Encounter for screening for COVID-19     Discharge Instructions      You most likely have a viral sore throat.  Symptoms should improve over the next week to 10 days.  If you develop chest pain or shortness of breath, go to the emergency room.  Rapid strep throat test is negative, throat culture is pending.  Will call you later this week if it comes back positive.  We have tested you today for COVID-19.  You will see the results in Mychart and we will call you with positive results.    Please stay home and isolate until you are aware of the results.    Some things that can make you feel better are: - Increased rest - Increasing fluid with water/sugar  free electrolytes - Acetaminophen and ibuprofen as needed for fever/pain - Salt water gargling, chloraseptic spray and throat lozenges for sore throat - Lidocaine rinses for sore throat - Saline rinses or neti pot to help rinse out congestion and prevent post nasal drainage     ED Prescriptions     Medication Sig Dispense Auth. Provider   lidocaine (XYLOCAINE) 2 % solution Use as directed 15 mLs in the mouth or throat every 3 (three) hours as needed for mouth pain. 100 mL Eulogio Bear, NP      PDMP not reviewed this encounter.   Eulogio Bear, NP 01/26/22 1534

## 2022-01-26 NOTE — Discharge Instructions (Addendum)
You most likely have a viral sore throat.  Symptoms should improve over the next week to 10 days.  If you develop chest pain or shortness of breath, go to the emergency room.  Rapid strep throat test is negative, throat culture is pending.  Will call you later this week if it comes back positive.  We have tested you today for COVID-19.  You will see the results in Mychart and we will call you with positive results.    Please stay home and isolate until you are aware of the results.    Some things that can make you feel better are: - Increased rest - Increasing fluid with water/sugar free electrolytes - Acetaminophen and ibuprofen as needed for fever/pain - Salt water gargling, chloraseptic spray and throat lozenges for sore throat - Lidocaine rinses for sore throat - Saline rinses or neti pot to help rinse out congestion and prevent post nasal drainage

## 2022-01-27 ENCOUNTER — Telehealth: Payer: Self-pay | Admitting: Emergency Medicine

## 2022-01-27 LAB — SARS CORONAVIRUS 2 (TAT 6-24 HRS): SARS Coronavirus 2: NEGATIVE

## 2022-01-27 NOTE — Telephone Encounter (Signed)
Pt called and reported no improvement of throat pain with discharge prescriptions and otc recommendations. Pt reports "it is painful to swallow and nothing is working, I know my body I don't think this is viral."  Consulted NP and reported throat culture still pending and no other recommendations at this time. Pt verbalized understanding but remained agitated.

## 2022-01-29 LAB — CULTURE, GROUP A STREP (THRC)

## 2022-03-12 ENCOUNTER — Other Ambulatory Visit: Payer: Self-pay | Admitting: Orthopedic Surgery

## 2022-03-12 DIAGNOSIS — M1712 Unilateral primary osteoarthritis, left knee: Secondary | ICD-10-CM

## 2022-03-12 DIAGNOSIS — M25562 Pain in left knee: Secondary | ICD-10-CM

## 2022-03-12 DIAGNOSIS — M1710 Unilateral primary osteoarthritis, unspecified knee: Secondary | ICD-10-CM

## 2022-03-19 ENCOUNTER — Encounter: Payer: Self-pay | Admitting: Radiology

## 2022-03-26 ENCOUNTER — Other Ambulatory Visit: Payer: Self-pay | Admitting: Orthopedic Surgery

## 2022-03-26 DIAGNOSIS — M25562 Pain in left knee: Secondary | ICD-10-CM

## 2022-03-26 DIAGNOSIS — M1712 Unilateral primary osteoarthritis, left knee: Secondary | ICD-10-CM

## 2022-03-26 DIAGNOSIS — M1710 Unilateral primary osteoarthritis, unspecified knee: Secondary | ICD-10-CM

## 2022-03-26 MED ORDER — MELOXICAM 7.5 MG PO TABS: 7.5000 mg | ORAL_TABLET | Freq: Two times a day (BID) | ORAL | 0 refills | Status: DC

## 2022-03-26 NOTE — Telephone Encounter (Signed)
Patient called and states [facility] sent a refill on her R/X last Tuesday and we have not heard anything from [place] about to refill it.   Patient needs her medicine she will be out of it the first of next week.    meloxicam (MOBIC) 7.5 MG tablet    Please call hr back and she wants that called into [place]

## 2022-03-30 ENCOUNTER — Telehealth: Payer: Self-pay | Admitting: Orthopedic Surgery

## 2022-03-30 NOTE — Telephone Encounter (Signed)
Returned the patient's call regarding a refill.  Advised her that the Meloxicam was escribed on 03/26/22 and that it's shows the pharmacy received it on 03/26/22 at 2:58pm.

## 2022-04-20 DIAGNOSIS — Z1329 Encounter for screening for other suspected endocrine disorder: Secondary | ICD-10-CM | POA: Diagnosis not present

## 2022-04-20 DIAGNOSIS — Z1321 Encounter for screening for nutritional disorder: Secondary | ICD-10-CM | POA: Diagnosis not present

## 2022-04-20 DIAGNOSIS — Z6825 Body mass index (BMI) 25.0-25.9, adult: Secondary | ICD-10-CM | POA: Diagnosis not present

## 2022-04-20 DIAGNOSIS — Z13228 Encounter for screening for other metabolic disorders: Secondary | ICD-10-CM | POA: Diagnosis not present

## 2022-04-20 DIAGNOSIS — Z131 Encounter for screening for diabetes mellitus: Secondary | ICD-10-CM | POA: Diagnosis not present

## 2022-04-20 DIAGNOSIS — Z1231 Encounter for screening mammogram for malignant neoplasm of breast: Secondary | ICD-10-CM | POA: Diagnosis not present

## 2022-04-20 DIAGNOSIS — Z01419 Encounter for gynecological examination (general) (routine) without abnormal findings: Secondary | ICD-10-CM | POA: Diagnosis not present

## 2022-04-20 DIAGNOSIS — Z13220 Encounter for screening for lipoid disorders: Secondary | ICD-10-CM | POA: Diagnosis not present

## 2022-05-25 ENCOUNTER — Other Ambulatory Visit: Payer: Self-pay | Admitting: Orthopedic Surgery

## 2022-05-25 DIAGNOSIS — M1712 Unilateral primary osteoarthritis, left knee: Secondary | ICD-10-CM

## 2022-05-25 DIAGNOSIS — M25562 Pain in left knee: Secondary | ICD-10-CM

## 2022-05-25 DIAGNOSIS — M1710 Unilateral primary osteoarthritis, unspecified knee: Secondary | ICD-10-CM

## 2022-05-25 MED ORDER — MELOXICAM 7.5 MG PO TABS: 7.5000 mg | ORAL_TABLET | Freq: Two times a day (BID) | ORAL | 5 refills | Status: DC

## 2022-05-25 NOTE — Telephone Encounter (Signed)
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

## 2022-05-26 ENCOUNTER — Telehealth: Payer: Self-pay | Admitting: Orthopedic Surgery

## 2022-05-26 NOTE — Telephone Encounter (Signed)
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  Tried to call the patient back to let her know we got her message, mailbox is full, unable to leave a message.

## 2022-07-20 DIAGNOSIS — D485 Neoplasm of uncertain behavior of skin: Secondary | ICD-10-CM | POA: Diagnosis not present

## 2022-07-20 DIAGNOSIS — L570 Actinic keratosis: Secondary | ICD-10-CM | POA: Diagnosis not present

## 2022-07-20 DIAGNOSIS — D225 Melanocytic nevi of trunk: Secondary | ICD-10-CM | POA: Diagnosis not present

## 2022-07-20 DIAGNOSIS — L918 Other hypertrophic disorders of the skin: Secondary | ICD-10-CM | POA: Diagnosis not present

## 2022-07-30 DIAGNOSIS — L239 Allergic contact dermatitis, unspecified cause: Secondary | ICD-10-CM | POA: Diagnosis not present

## 2022-09-08 ENCOUNTER — Encounter: Payer: Self-pay | Admitting: Emergency Medicine

## 2022-09-08 ENCOUNTER — Ambulatory Visit
Admission: EM | Admit: 2022-09-08 | Discharge: 2022-09-08 | Disposition: A | Discharge status: Home / Self Care | Payer: 59 | Attending: Family Medicine | Admitting: Family Medicine

## 2022-09-08 ENCOUNTER — Other Ambulatory Visit: Payer: Self-pay

## 2022-09-08 DIAGNOSIS — U071 COVID-19: Secondary | ICD-10-CM | POA: Insufficient documentation

## 2022-09-08 DIAGNOSIS — R059 Cough, unspecified: Secondary | ICD-10-CM | POA: Insufficient documentation

## 2022-09-08 DIAGNOSIS — J069 Acute upper respiratory infection, unspecified: Secondary | ICD-10-CM | POA: Diagnosis not present

## 2022-09-08 DIAGNOSIS — B9789 Other viral agents as the cause of diseases classified elsewhere: Secondary | ICD-10-CM | POA: Insufficient documentation

## 2022-09-08 MED ORDER — FLUTICASONE PROPIONATE 50 MCG/ACT NA SUSP: 1.0000 | Freq: Two times a day (BID) | NASAL | 2 refills | Status: AC

## 2022-09-08 MED ORDER — PROMETHAZINE-DM 6.25-15 MG/5ML PO SYRP: 5.0000 mL | ORAL_SOLUTION | Freq: Four times a day (QID) | ORAL | 0 refills | Status: AC | PRN

## 2022-09-08 NOTE — ED Triage Notes (Signed)
Pt reports nasal congestion, chills, cough, intermittent fever since Sunday. Denies any known exposures.

## 2022-09-08 NOTE — ED Provider Notes (Signed)
[facility]    CSN: [id number] Arrival date & time: 09/08/22  0934      History   Chief Complaint Chief Complaint  Patient presents with   Nasal Congestion    HPI April Marsh is a 62 y.o. female.   Patient presenting today with 2-day history of nasal congestion, chills, cough, intermittent fevers, fatigue.  Denies chest pain, shortness of breath, abdominal pain, nausea vomiting or diarrhea.  So far not tried anything over-the-counter for symptoms.  No known sick contacts recently.    Past Medical History:  Diagnosis Date   Hypertension    being monitored by PCP   PONV (postoperative nausea and vomiting)    Seasonal allergies     Patient Active Problem List   Diagnosis Date Noted   Benign neoplasm of skin 11/06/2021   Osteopenia 12/20/2020   Hypertensive disorder 12/20/2020   OAB (overactive bladder) 06/07/2020   Microhematuria 06/07/2020   Breast lump 05/31/2012    Past Surgical History:  Procedure Laterality Date   CERVICAL SPINE SURGERY  2007   due to herniated disc and pinched nerve   WISDOM TOOTH EXTRACTION      OB History   No obstetric history on file.      Home Medications    Prior to Admission medications   Medication Sig Start Date End Date Taking? Authorizing Provider  fluticasone (FLONASE) 50 MCG/ACT nasal spray Place 1 spray into both nostrils 2 (two) times daily. 09/08/22  Yes Particia Nearing, PA-C  promethazine-dextromethorphan (PROMETHAZINE-DM) 6.25-15 MG/5ML syrup Take 5 mLs by mouth 4 (four) times daily as needed. 09/08/22  Yes Particia Nearing, PA-C  acetaminophen (TYLENOL) 650 MG CR tablet Take 1,300 mg by mouth every 8 (eight) hours as needed for pain.    [provider]  hydrocortisone-pramoxine [facility]) 2.5-1 % rectal cream APPLY A THIN FILM TO THESAFFECTED AREA 3 TO 4 TIMES DAILY. Patient not taking: Reported on 11/06/2021 02/25/21   [provider]  lidocaine (XYLOCAINE) 2 %  solution Use as directed 15 mLs in the mouth or throat every 3 (three) hours as needed for mouth pain. 01/26/22   Valentino Nose, NP  meloxicam (MOBIC) 7.5 MG tablet Take 1 tablet (7.5 mg total) by mouth 2 (two) times daily. 05/25/22   Vickki Hearing, MD    Family History Family History  Problem Relation Age of Onset   Colon polyps Mother    Hypertension Mother    Diabetes Maternal Uncle    Colon cancer Neg Hx    Esophageal cancer Neg Hx    Rectal cancer Neg Hx    Stomach cancer Neg Hx     Social History Social History   Tobacco Use   Smoking status: Never   Smokeless tobacco: Never  Vaping Use   Vaping status: Never Used  Substance Use Topics   Alcohol use: Not Currently    Alcohol/week: 0.0 - 1.0 standard drinks of alcohol    Comment: occassionally   Drug use: No     Allergies   Codeine and Penicillins   Review of Systems Review of Systems Per HPI  Physical Exam Triage Vital Signs ED Triage Vitals  Encounter Vitals Group     BP 09/08/22 1014 (!) 154/84     Systolic BP Percentile --      Diastolic BP Percentile --      Pulse Rate 09/08/22 1014 79     Resp 09/08/22 1014 20     Temp  09/08/22 1014 99 F (37.2 C)     Temp Source 09/08/22 1014 Oral     SpO2 09/08/22 1014 98 %     Weight --      Height --      Head Circumference --      Peak Flow --      Pain Score 09/08/22 1013 10     Pain Loc --      Pain Education --      Exclude from Growth Chart --    No data found.  Updated Vital Signs BP (!) 154/84 (BP Location: Right Arm)   Pulse 79   Temp 99 F (37.2 C) (Oral)   Resp 20   SpO2 98%   Visual Acuity Right Eye Distance:   Left Eye Distance:   Bilateral Distance:    Right Eye Near:   Left Eye Near:    Bilateral Near:     Physical Exam Vitals and nursing note reviewed.  Constitutional:      Appearance: Normal appearance.  HENT:     Head: Atraumatic.     Right Ear: Tympanic membrane and external ear normal.     Left Ear:  Tympanic membrane and external ear normal.     Nose: Rhinorrhea present.     Mouth/Throat:     Mouth: Mucous membranes are moist.     Pharynx: Posterior oropharyngeal erythema present.  Eyes:     Extraocular Movements: Extraocular movements intact.     Conjunctiva/sclera: Conjunctivae normal.  Cardiovascular:     Rate and Rhythm: Normal rate and regular rhythm.     Heart sounds: Normal heart sounds.  Pulmonary:     Effort: Pulmonary effort is normal.     Breath sounds: Normal breath sounds. No wheezing or rales.  Musculoskeletal:        General: Normal range of motion.     Cervical back: Normal range of motion and neck supple.  Skin:    General: Skin is warm and dry.  Neurological:     Mental Status: She is alert and oriented to person, place, and time.  Psychiatric:        Mood and Affect: Mood normal.        Thought Content: Thought content normal.      UC Treatments / Results  Labs (all labs ordered are listed, but only abnormal results are displayed) Labs Reviewed  SARS CORONAVIRUS 2 (TAT 6-24 HRS)    EKG   Radiology No results found.  Procedures Procedures (including critical care time)  Medications Ordered in UC Medications - No data to display  Initial Impression / Assessment and Plan / UC Course  I have reviewed the triage vital signs and the nursing notes.  Pertinent labs & imaging results that were available during my care of the patient were reviewed by me and considered in my medical decision making (see chart for details).     Consistent with viral upper respiratory infection, COVID testing pending, treat with Phenergan DM, Flonase, supportive over-the-counter medications and home care while waiting results.  Return for worsening symptoms.  Final Clinical Impressions(s) / UC Diagnoses   Final diagnoses:  Viral URI with cough     Discharge Instructions      I have sent over some medications to help with your symptoms and you may also take  over-the-counter remedies such as Mucinex, DayQuil, NyQuil and you sinus rinses.  Ibuprofen and Tylenol for pain.  Your COVID test should be back tomorrow  and if positive, someone will reach out to discuss if you are interested in antiviral therapy.    ED Prescriptions     Medication Sig Dispense Auth. Provider   promethazine-dextromethorphan (PROMETHAZINE-DM) 6.25-15 MG/5ML syrup Take 5 mLs by mouth 4 (four) times daily as needed. 100 mL Particia Nearing, PA-C   fluticasone [facility]) 50 MCG/ACT nasal spray Place 1 spray into both nostrils 2 (two) times daily. 16 g Particia Nearing, [place]      PDMP not reviewed this encounter.   Particia Nearing, [place] 09/08/22 1906

## 2022-09-08 NOTE — Discharge Instructions (Signed)
I have sent over some medications to help with your symptoms and you may also take over-the-counter remedies such as Mucinex, DayQuil, NyQuil and you sinus rinses.  Ibuprofen and Tylenol for pain.  Your COVID test should be back tomorrow and if positive, someone will reach out to discuss if you are interested in antiviral therapy.

## 2022-09-09 ENCOUNTER — Telehealth: Payer: Self-pay

## 2022-09-09 LAB — SARS CORONAVIRUS 2 (TAT 6-24 HRS): SARS Coronavirus 2: POSITIVE — AB

## 2022-09-16 ENCOUNTER — Other Ambulatory Visit: Payer: Self-pay

## 2022-09-16 ENCOUNTER — Ambulatory Visit
Admission: RE | Admit: 2022-09-16 | Discharge: 2022-09-16 | Disposition: A | Discharge status: Home / Self Care | Payer: 59 | Source: Ambulatory Visit | Attending: Family Medicine | Admitting: Family Medicine

## 2022-09-16 VITALS — HR 82 | Temp 98.1°F | Resp 20

## 2022-09-16 DIAGNOSIS — H6121 Impacted cerumen, right ear: Secondary | ICD-10-CM

## 2022-09-16 DIAGNOSIS — H6993 Unspecified Eustachian tube disorder, bilateral: Secondary | ICD-10-CM

## 2022-09-16 MED ORDER — PREDNISONE 20 MG PO TABS: 40.0000 mg | ORAL_TABLET | Freq: Every day | ORAL | 0 refills | Status: DC

## 2022-09-16 MED ORDER — CARBAMIDE PEROXIDE 6.5 % OT SOLN: 5.0000 [drp] | Freq: Two times a day (BID) | OTIC | 0 refills | Status: AC

## 2022-09-16 NOTE — ED Provider Notes (Signed)
[facility]    CSN: [id number] Arrival date & time: 09/16/22  0630      History   Chief Complaint Chief Complaint  Patient presents with   Ear Fullness    Was in there last tue . Tested pos for covid, but now ear hurting and ringing.  I need a antibiotic I do believe - Entered by patient    HPI April Marsh is a 62 y.o. female.   Patient presenting today with ongoing cough, several day history of pressure, popping and discomfort to the ears particular the right ear following a recent COVID diagnosis last week.  States overall those symptoms have improved with Phenergan DM, Flonase and DayQuil and NyQuil but the ear pain is very bothersome at this point.  Denies fever, chills, chest pain, shortness of breath, abdominal pain, nausea vomiting or diarrhea.    Past Medical History:  Diagnosis Date   Hypertension    being monitored by PCP   PONV (postoperative nausea and vomiting)    Seasonal allergies     Patient Active Problem List   Diagnosis Date Noted   Benign neoplasm of skin 11/06/2021   Osteopenia 12/20/2020   Hypertensive disorder 12/20/2020   OAB (overactive bladder) 06/07/2020   Microhematuria 06/07/2020   Breast lump 05/31/2012    Past Surgical History:  Procedure Laterality Date   CERVICAL SPINE SURGERY  2007   due to herniated disc and pinched nerve   WISDOM TOOTH EXTRACTION      OB History   No obstetric history on file.      Home Medications    Prior to Admission medications   Medication Sig Start Date End Date Taking? Authorizing Provider  carbamide peroxide (DEBROX) 6.5 % OTIC solution Place 5 drops into the right ear 2 (two) times daily. 09/16/22  Yes Particia Nearing, PA-C  predniSONE (DELTASONE) 20 MG tablet Take 2 tablets (40 mg total) by mouth daily with breakfast. 09/16/22  Yes Particia Nearing, PA-C  acetaminophen (TYLENOL) 650 MG CR tablet Take 1,300 mg by mouth every 8 (eight) hours as needed for pain.     [provider]  fluticasone (FLONASE) 50 MCG/ACT nasal spray Place 1 spray into both nostrils 2 (two) times daily. 09/08/22   Particia Nearing, PA-C  hydrocortisone-pramoxine [facility]) 2.5-1 % rectal cream APPLY A THIN FILM TO THESAFFECTED AREA 3 TO 4 TIMES DAILY. Patient not taking: Reported on 11/06/2021 02/25/21   [provider]  lidocaine (XYLOCAINE) 2 % solution Use as directed 15 mLs in the mouth or throat every 3 (three) hours as needed for mouth pain. 01/26/22   Valentino Nose, NP  meloxicam (MOBIC) 7.5 MG tablet Take 1 tablet (7.5 mg total) by mouth 2 (two) times daily. 05/25/22   Vickki Hearing, MD  promethazine-dextromethorphan (PROMETHAZINE-DM) 6.25-15 MG/5ML syrup Take 5 mLs by mouth 4 (four) times daily as needed. 09/08/22   Particia Nearing, PA-C    Family History Family History  Problem Relation Age of Onset   Colon polyps Mother    Hypertension Mother    Diabetes Maternal Uncle    Colon cancer Neg Hx    Esophageal cancer Neg Hx    Rectal cancer Neg Hx    Stomach cancer Neg Hx     Social History Social History   Tobacco Use   Smoking status: Never   Smokeless tobacco: Never  Vaping Use   Vaping status: Never Used  Substance Use Topics  Alcohol use: Not Currently    Alcohol/week: 0.0 - 1.0 standard drinks of alcohol    Comment: occassionally   Drug use: No     Allergies   Codeine and Penicillins   Review of Systems Review of Systems Per HPI  Physical Exam Triage Vital Signs ED Triage Vitals  Encounter Vitals Group     BP --      Systolic BP Percentile --      Diastolic BP Percentile --      Pulse Rate 09/16/22 1003 82     Resp 09/16/22 1003 20     Temp 09/16/22 1003 98.1 F (36.7 C)     Temp Source 09/16/22 1003 Oral     SpO2 09/16/22 1003 98 %     Weight --      Height --      Head Circumference --      Peak Flow --      Pain Score 09/16/22 1004 0     Pain Loc --      Pain Education --       Exclude from Growth Chart --    No data found.  Updated Vital Signs Pulse 82   Temp 98.1 F (36.7 C) (Oral)   Resp 20   SpO2 98%   Visual Acuity Right Eye Distance:   Left Eye Distance:   Bilateral Distance:    Right Eye Near:   Left Eye Near:    Bilateral Near:     Physical Exam Vitals and nursing note reviewed.  Constitutional:      Appearance: Normal appearance.  HENT:     Head: Atraumatic.     Right Ear: There is impacted cerumen.     Left Ear: External ear normal.     Ears:     Comments: Unable to visualize right TM due to significant cerumen impaction.  Left middle ear effusion present    Nose: Nose normal.     Mouth/Throat:     Mouth: Mucous membranes are moist.     Pharynx: No posterior oropharyngeal erythema.  Eyes:     Extraocular Movements: Extraocular movements intact.     Conjunctiva/sclera: Conjunctivae normal.  Cardiovascular:     Rate and Rhythm: Normal rate and regular rhythm.     Heart sounds: Normal heart sounds.  Pulmonary:     Effort: Pulmonary effort is normal.     Breath sounds: Normal breath sounds. No wheezing or rales.  Musculoskeletal:        General: Normal range of motion.     Cervical back: Normal range of motion and neck supple.  Skin:    General: Skin is warm and dry.  Neurological:     Mental Status: She is alert and oriented to person, place, and time.  Psychiatric:        Mood and Affect: Mood normal.        Thought Content: Thought content normal.      UC Treatments / Results  Labs (all labs ordered are listed, but only abnormal results are displayed) Labs Reviewed - No data to display  EKG   Radiology No results found.  Procedures Procedures (including critical care time)  Medications Ordered in UC Medications - No data to display  Initial Impression / Assessment and Plan / UC Course  I have reviewed the triage vital signs and the nursing notes.  Pertinent labs & imaging results that were available  during my care of the patient were reviewed by  me and considered in my medical decision making (see chart for details).     Overall very well-appearing and in no acute distress with normal vital signs.  Cerumen impaction to be treated with Debrox drops, warm water lavage until discomfort resolves from eustachian tube dysfunction.  If still not able to clear the earwax may come in for lavage.  Prednisone, Flonase, decongestants for eustachian tube dysfunction.  No evidence of bacterial infection at this time.  Return for worsening symptoms.  Final Clinical Impressions(s) / UC Diagnoses   Final diagnoses:  Eustachian tube dysfunction, bilateral  Right ear impacted cerumen   Discharge Instructions   None    ED Prescriptions     Medication Sig Dispense Auth. Provider   predniSONE (DELTASONE) 20 MG tablet Take 2 tablets (40 mg total) by mouth daily with breakfast. 10 tablet Particia Nearing, PA-C   carbamide peroxide (DEBROX) 6.5 % OTIC solution Place 5 drops into the right ear 2 (two) times daily. 15 mL Particia Nearing, [place]      PDMP not reviewed this encounter.   Particia Nearing, [place] 09/16/22 1030

## 2022-09-16 NOTE — ED Triage Notes (Addendum)
Pt reports was diagnosed with covid last week, has tried otc and px medication with no change in symptoms. Pt states throbbing sensation in right ear since Sunday night.

## 2022-11-24 IMAGING — CT CT ABD-PEL WO/W CM
4 of 12 series · 11 of 46 positions shown, 17 images · IV contrast (Omnipaque or Isovue)
Comparison: None.

CLINICAL DATA: Microscopic hematuria and proteinuria. Right flank
pain.

EXAM:
CT ABDOMEN AND PELVIS WITHOUT AND WITH CONTRAST
TECHNIQUE: Multidetector CT imaging of the abdomen and pelvis was performed
following the standard protocol before and following the bolus
administration of intravenous contrast.
CONTRAST:  100mL OMNIPAQUE IOHEXOL 300 MG/ML SOLN, 25mL OMNIPAQUE
IOHEXOL 300 MG/ML SOLN

[Series 2: axial pre · axial · non-contrast · 0.79mm/px · z∈[+1214,+1444]mm · 3 of 93 slices shown]
[im 24/93  soft-tissue]
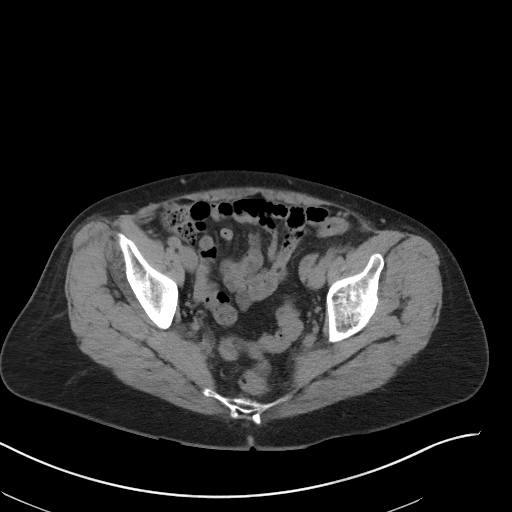
[im 47/93  soft-tissue]
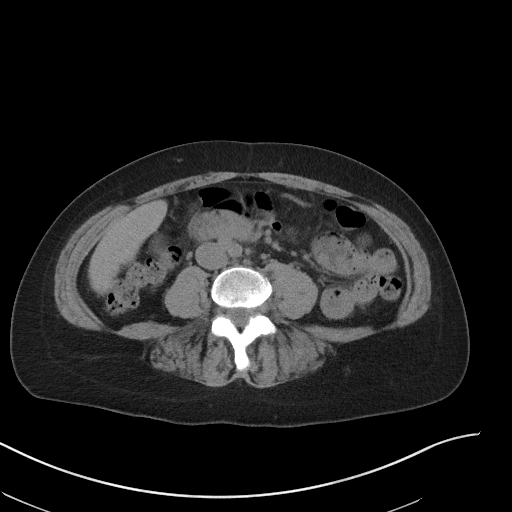
[im 70/93  soft-tissue]
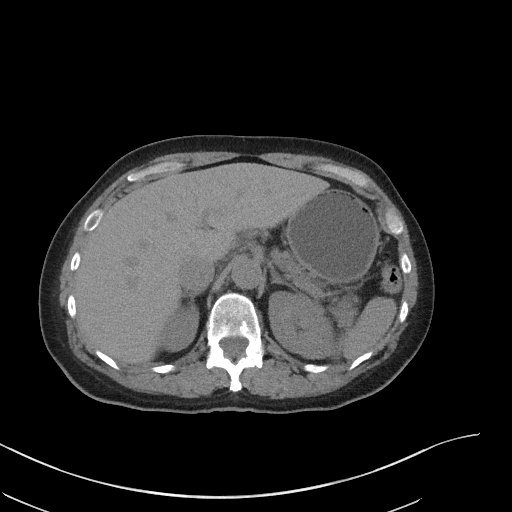

[Series 3: axial post · axial · 0.79mm/px · z∈[+1189,+1464]mm · 4 of 93 slices shown, 9 images]
[im 19/93  soft-tissue]
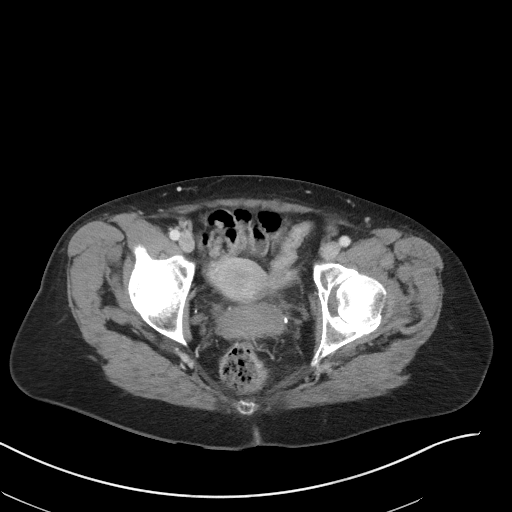
[im 19/93  lung]
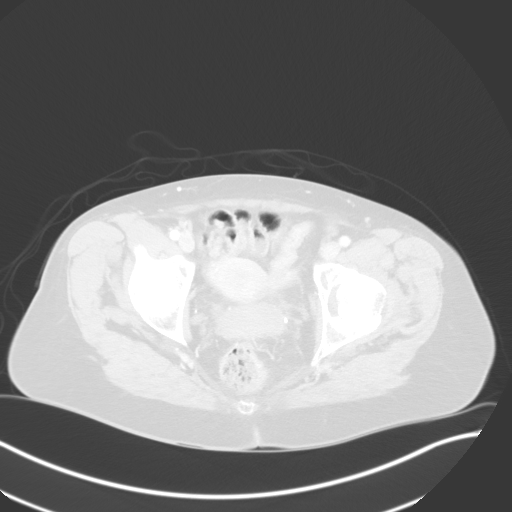
[im 19/93  bone]
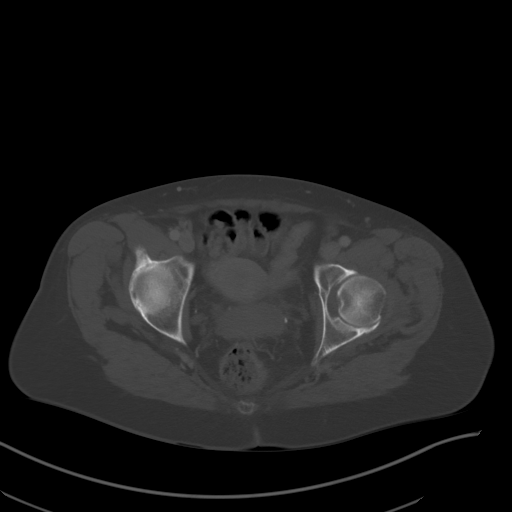
[im 37/93  soft-tissue]
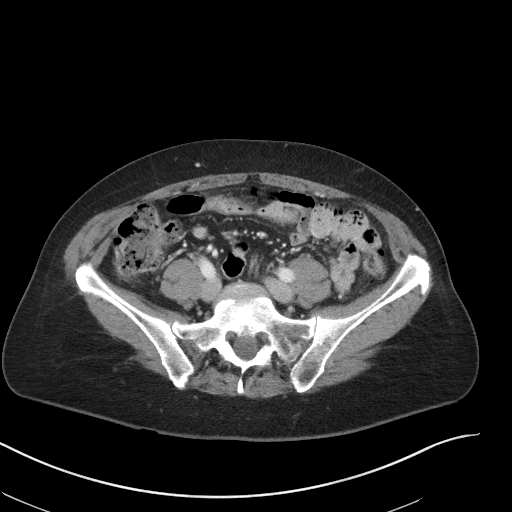
[im 37/93  lung]
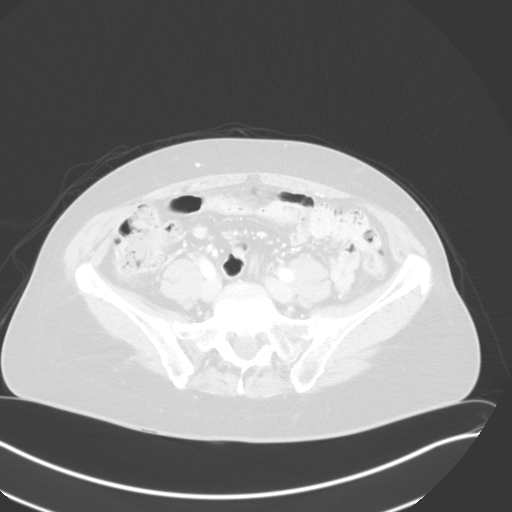
[im 56/93  soft-tissue]
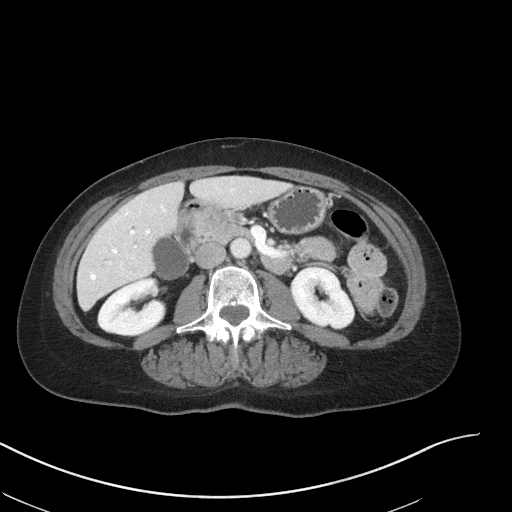
[im 56/93  lung]
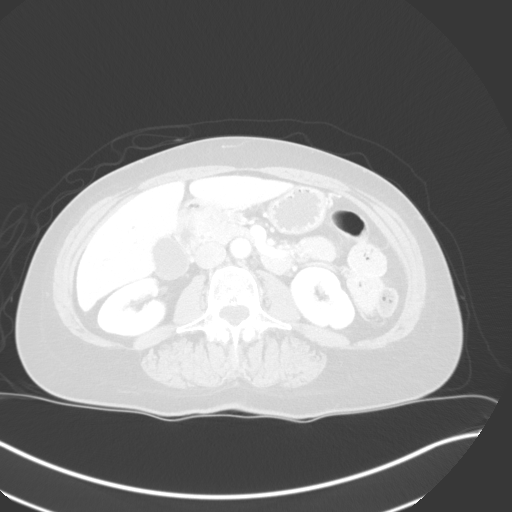
[im 74/93  soft-tissue]
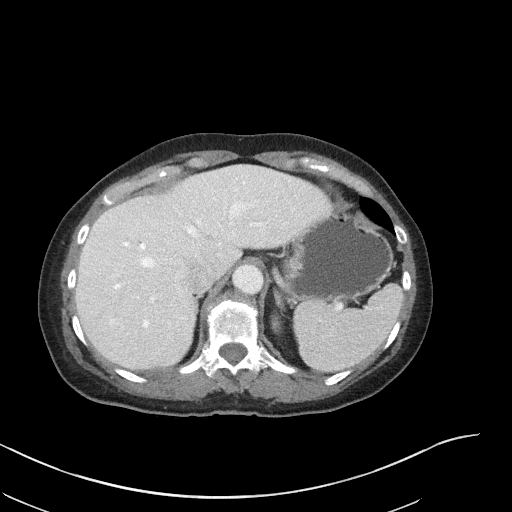
[im 74/93  lung]
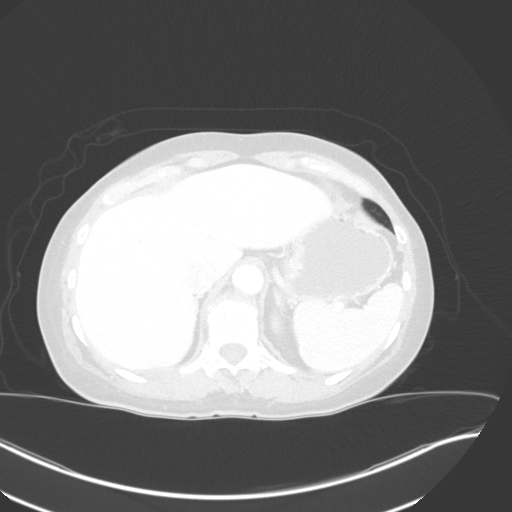

[Series 4: axial delay · axial · delayed · 0.79mm/px · z∈[+1195,+1285]mm · 2 of 90 slices shown]
[im 18/90  soft-tissue]
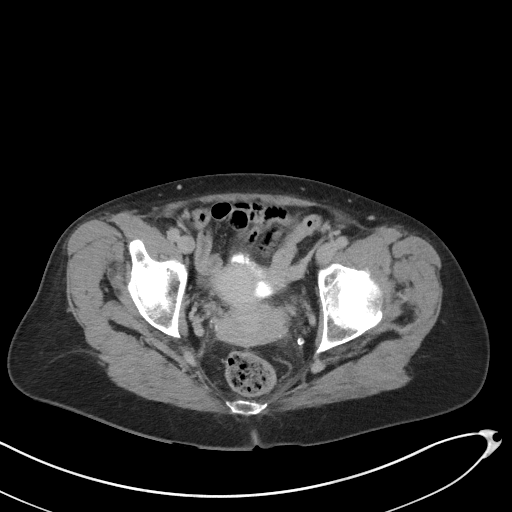
[im 36/90  soft-tissue]
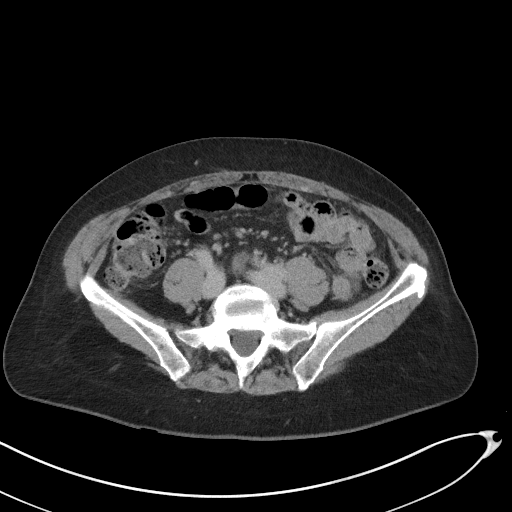

[Series 7: coronal pre · coronal · non-contrast · 0.69mm/px · 2 of 101 slices shown, 3 images]
[im 34/101  soft-tissue]
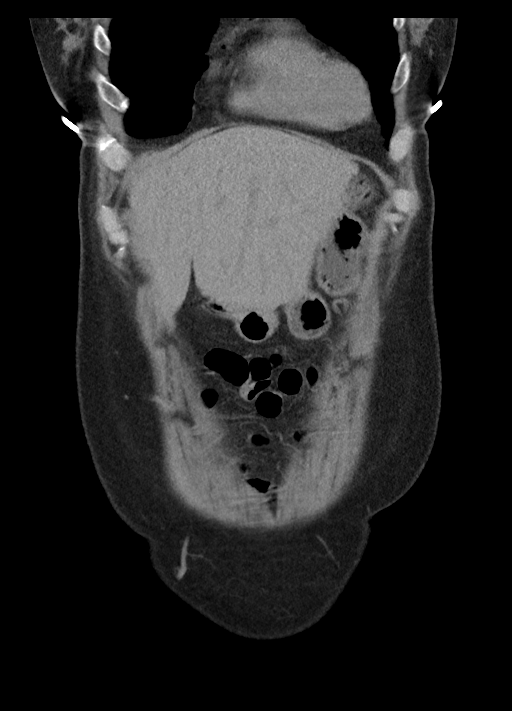
[im 34/101  bone]
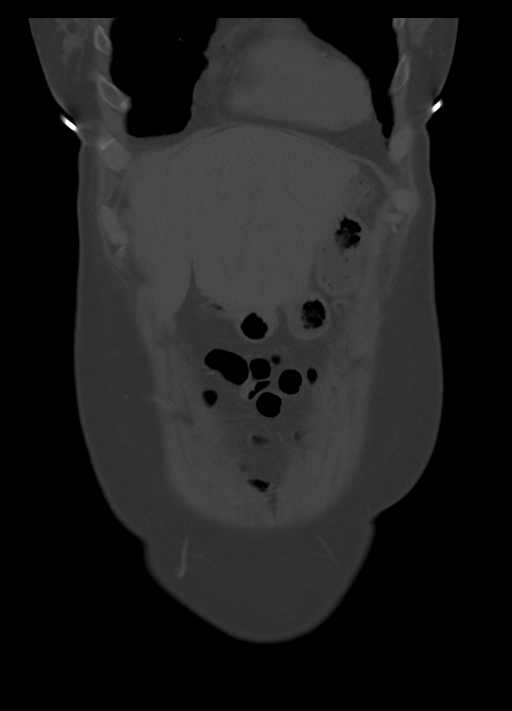
[im 67/101  soft-tissue]
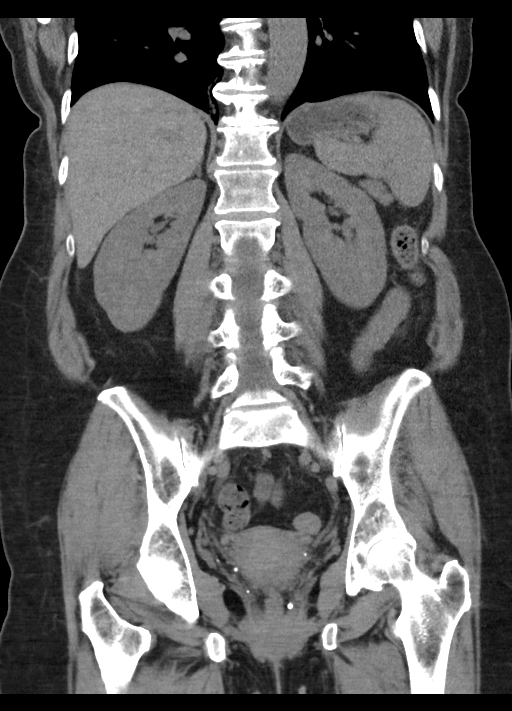

[11 of 46 positions shown; findings below may reference images not displayed]

FINDINGS: Lower chest: Bibasilar atelectasis versus scarring. Normal size
heart. No significant pericardial effusion/thickening.

Hepatobiliary: Mild hepatic enlargement measuring 20.5 cm in maximum
craniocaudal dimension. No suspicious hepatic lesion. Gallbladder is
unremarkable.

Pancreas: Within normal limits.

Spleen: Within normal limits.

Adrenals/Urinary Tract: Bilateral adrenal glands are unremarkable.

No renal, ureteral or bladder calculi visualized. No solid enhancing
renal masses. No hydronephrosis. Symmetric enhancement and excretion
of contrast from the bilateral kidneys. No suspicious filling defect
visualized within the opacified portions of the collecting system or
ureters on delayed imaging. However, the bilateral mid/distal
ureters are not well opacified limiting evaluation.

Urinary bladder is grossly unremarkable for degree of distension.

Stomach/Bowel: Small hiatal hernia otherwise the stomach is grossly
unremarkable. Normal positioning of the duodenum/ligament of [place].
No pathologic dilation a small bowel. The appendix and terminal
ileum appear grossly unremarkable. The sigmoid and transverse colon
are predominantly decompressed limiting evaluation. Otherwise no
suspicious colonic wall thickening or mass like lesions visualized.

Vascular/Lymphatic: Aortic atherosclerosis without aneurysmal
dilation. No pathologically enlarged abdominal or pelvic lymph
nodes.

Reproductive: Uterus and bilateral adnexa are unremarkable.

Other: No abdominopelvic ascites.

Musculoskeletal: Multilevel degenerative changes spine. No acute
osseous abnormality.
IMPRESSION: 1. No hydronephrosis. No renal, ureteral, or bladder calculi
visualized. No solid enhancing renal masses.
2. Mild hepatomegaly.
3. Small hiatal hernia.
4. Aortic atherosclerosis.

Aortic Atherosclerosis ([id number]-774.4).

## 2023-05-24 DIAGNOSIS — Z1231 Encounter for screening mammogram for malignant neoplasm of breast: Secondary | ICD-10-CM | POA: Diagnosis not present

## 2023-05-24 DIAGNOSIS — Z131 Encounter for screening for diabetes mellitus: Secondary | ICD-10-CM | POA: Diagnosis not present

## 2023-05-24 DIAGNOSIS — Z1321 Encounter for screening for nutritional disorder: Secondary | ICD-10-CM | POA: Diagnosis not present

## 2023-05-24 DIAGNOSIS — Z13220 Encounter for screening for lipoid disorders: Secondary | ICD-10-CM | POA: Diagnosis not present

## 2023-05-24 DIAGNOSIS — Z13228 Encounter for screening for other metabolic disorders: Secondary | ICD-10-CM | POA: Diagnosis not present

## 2023-05-24 DIAGNOSIS — Z13820 Encounter for screening for osteoporosis: Secondary | ICD-10-CM | POA: Diagnosis not present

## 2023-05-24 DIAGNOSIS — Z124 Encounter for screening for malignant neoplasm of cervix: Secondary | ICD-10-CM | POA: Diagnosis not present

## 2023-05-24 DIAGNOSIS — Z6826 Body mass index (BMI) 26.0-26.9, adult: Secondary | ICD-10-CM | POA: Diagnosis not present

## 2023-05-24 DIAGNOSIS — Z1329 Encounter for screening for other suspected endocrine disorder: Secondary | ICD-10-CM | POA: Diagnosis not present

## 2023-05-24 DIAGNOSIS — Z01419 Encounter for gynecological examination (general) (routine) without abnormal findings: Secondary | ICD-10-CM | POA: Diagnosis not present

## 2023-05-24 DIAGNOSIS — Z1151 Encounter for screening for human papillomavirus (HPV): Secondary | ICD-10-CM | POA: Diagnosis not present

## 2023-06-09 ENCOUNTER — Telehealth: Payer: Self-pay | Admitting: Nurse Practitioner

## 2023-06-09 ENCOUNTER — Ambulatory Visit: Admission: EM | Admit: 2023-06-09 | Discharge: 2023-06-09 | Disposition: A | Discharge status: Home / Self Care

## 2023-06-09 ENCOUNTER — Ambulatory Visit (INDEPENDENT_AMBULATORY_CARE_PROVIDER_SITE_OTHER)

## 2023-06-09 DIAGNOSIS — M25461 Effusion, right knee: Secondary | ICD-10-CM | POA: Diagnosis not present

## 2023-06-09 DIAGNOSIS — M25561 Pain in right knee: Secondary | ICD-10-CM

## 2023-06-09 DIAGNOSIS — Z8739 Personal history of other diseases of the musculoskeletal system and connective tissue: Secondary | ICD-10-CM | POA: Diagnosis not present

## 2023-06-09 MED ORDER — DEXAMETHASONE SODIUM PHOSPHATE 10 MG/ML IJ SOLN
10.0000 mg | INTRAMUSCULAR | Status: AC
  Administered 2023-06-09: 10 mg via INTRAMUSCULAR

## 2023-06-09 MED ORDER — KETOROLAC TROMETHAMINE 30 MG/ML IJ SOLN
30.0000 mg | Freq: Once | INTRAMUSCULAR | Status: AC
  Administered 2023-06-09: 30 mg via INTRAMUSCULAR

## 2023-06-09 MED ORDER — PREDNISONE 20 MG PO TABS: 40.0000 mg | ORAL_TABLET | Freq: Every day | ORAL | 0 refills | Status: AC

## 2023-06-09 NOTE — Telephone Encounter (Signed)
 Reviewed patient's x-ray results of the right knee.  Call patient to discuss, verified patient with 2 patient identifiers.  Patient was advised the x-ray is negative for fracture or dislocation, discussed small joint effusion with the patient and possibility of osteoarthritis as she does have it in the left knee.  Patient was advised to use the Ace wrap as discussed at her appointment.  Patient was advised that if symptoms fail to improve, it is recommended that she follow-up with orthopedics for further evaluation.  Patient was in agreement with this plan of care and verbalized understanding.  All questions were answered.

## 2023-06-09 NOTE — Discharge Instructions (Addendum)
 X-ray of the right knee is pending.  You will be contacted when the results of the x-ray are received.  You will also have access to the results.  You MyChart. You were given an injection of Toradol 30 mg and Decadron  10 mg.  Do not take any additional NSAIDs such as Mobic , ibuprofen, Aleve, Advil, or naproxen for the next 24 hours.  You may take over-the-counter Tylenol for breakthrough pain or discomfort. RICE therapy, rest, ice, compression, and elevation.  Apply ice for 20 minutes, remove for 1 hour, repeat is much as possible to help with pain and swelling of the knee.  Also recommend the use of an ACE wrap to provide additional compression and support. As discussed, if symptoms fail to improve with this treatment, it is recommended that you follow-up with orthopedics for further evaluation. Follow-up as needed.

## 2023-06-09 NOTE — ED Provider Notes (Signed)
 [facility]    CSN: [id number] Arrival date & time: 06/09/23  1803      History   Chief Complaint No chief complaint on file.   HPI April Marsh is a 63 y.o. female.   The history is provided by the patient.   Patient presents with a 1-day history of right knee pain nd swelling. Patient denies recent injury or trauma. States she fell in the grocery store parking lot about 4 months ago.. 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 Complains of pain with bending of knee and with ambulation. Denies weakness of the knee, numbness, tingling or radiation of pain.  States that she has a history of osteoarthritis in the left knee, states that she has seen orthopedics and takes Mobic  for the pain.  States that the Mobic  has not helped the pain in her right knee.    Past Medical History:  Diagnosis Date   Hypertension    being monitored by PCP   PONV (postoperative nausea and vomiting)    Seasonal allergies     Patient Active Problem List   Diagnosis Date Noted   Benign neoplasm of skin 11/06/2021   Osteopenia 12/20/2020   Hypertensive disorder 12/20/2020   OAB (overactive bladder) 06/07/2020   Microhematuria 06/07/2020   Breast lump 05/31/2012    Past Surgical History:  Procedure Laterality Date   CERVICAL SPINE SURGERY  2007   due to herniated disc and pinched nerve   WISDOM TOOTH EXTRACTION      OB History   No obstetric history on file.      Home Medications    Prior to Admission medications   Medication Sig Start Date End Date Taking? Authorizing Provider  acetaminophen (TYLENOL) 650 MG CR tablet Take 1,300 mg by mouth every 8 (eight) hours as needed for pain.    [provider]  alendronate (FOSAMAX) 70 MG tablet Take 70 mg by mouth once a week.    [provider]  carbamide peroxide (DEBROX) 6.5 % OTIC solution Place 5 drops into the right ear 2 (two) times daily. 09/16/22   Corbin Dess, PA-C   fluticasone  (FLONASE ) 50 MCG/ACT nasal spray Place 1 spray into both nostrils 2 (two) times daily. 09/08/22   Corbin Dess, PA-C  hydrocortisone-pramoxine (ANALPRAM-HC) 2.5-1 % rectal cream APPLY A THIN FILM TO THESAFFECTED AREA 3 TO 4 TIMES DAILY. Patient not taking: Reported on 11/06/2021 02/25/21   [provider]  lidocaine  (XYLOCAINE ) 2 % solution Use as directed 15 mLs in the mouth or throat every 3 (three) hours as needed for mouth pain. 01/26/22   Wilhemena Harbour, NP  meloxicam  (MOBIC ) 7.5 MG tablet Take 1 tablet (7.5 mg total) by mouth 2 (two) times daily. 05/25/22   Darrin Emerald, MD  predniSONE  (DELTASONE ) 20 MG tablet Take 2 tablets (40 mg total) by mouth daily with breakfast for 5 days. 06/09/23 06/14/23 Yes Leath-Warren, Belen Bowers, NP  promethazine -dextromethorphan (PROMETHAZINE -DM) 6.25-15 MG/5ML syrup Take 5 mLs by mouth 4 (four) times daily as needed. 09/08/22   Corbin Dess, PA-C    Family History Family History  Problem Relation Age of Onset   Colon polyps Mother    Hypertension Mother    Diabetes Maternal Uncle    Colon cancer Neg Hx    Esophageal cancer Neg Hx    Rectal cancer Neg Hx    Stomach cancer Neg Hx     Social History Social History  Tobacco Use   Smoking status: Never   Smokeless tobacco: Never  Vaping Use   Vaping status: Never Used  Substance Use Topics   Alcohol use: Not Currently    Alcohol/week: 0.0 - 1.0 standard drinks of alcohol    Comment: occassionally   Drug use: No     Allergies   Codeine and Penicillins   Review of Systems Review of Systems Per HPI  Physical Exam Triage Vital Signs ED Triage Vitals  Encounter Vitals Group     BP 06/09/23 1805 (!) 150/76     Systolic BP Percentile --      Diastolic BP Percentile --      Pulse Rate 06/09/23 1805 89     Resp 06/09/23 1805 18     Temp 06/09/23 1805 98.2 F (36.8 C)     Temp Source 06/09/23 1805 Oral     SpO2 06/09/23 1805 95 %     Weight  --      Height --      Head Circumference --      Peak Flow --      Pain Score 06/09/23 1807 6     Pain Loc --      Pain Education --      Exclude from Growth Chart --    No data found.  Updated Vital Signs BP (!) 150/76 (BP Location: Right Arm)   Pulse 89   Temp 98.2 F (36.8 C) (Oral)   Resp 18   SpO2 95%   Visual Acuity Right Eye Distance:   Left Eye Distance:   Bilateral Distance:    Right Eye Near:   Left Eye Near:    Bilateral Near:     Physical Exam Vitals and nursing note reviewed.  Constitutional:      General: She is not in acute distress.    Appearance: Normal appearance.  HENT:     Head: Normocephalic.  Pulmonary:     Effort: Pulmonary effort is normal.  Musculoskeletal:     Cervical back: Normal range of motion.     Right knee: Swelling present. Decreased range of motion. Tenderness present over the medial joint line, lateral joint line, MCL and LCL. Normal pulse.  Skin:    General: Skin is warm and dry.  Neurological:     General: No focal deficit present.     Mental Status: She is alert and oriented to person, place, and time.  Psychiatric:        Mood and Affect: Mood normal.        Behavior: Behavior normal.     UC Treatments / Results  Labs (all labs ordered are listed, but only abnormal results are displayed) Labs Reviewed - No data to display  EKG   Radiology No results found.  Procedures Procedures (including critical care time)  Medications Ordered in UC Medications  ketorolac (TORADOL) 30 MG/ML injection 30 mg (30 mg Intramuscular Given 06/09/23 1823)  dexamethasone  (DECADRON ) injection 10 mg (10 mg Intramuscular Given 06/09/23 1822)    Initial Impression / Assessment and Plan / UC Course  I have reviewed the triage vital signs and the nursing notes.  Pertinent labs & imaging results that were available during my care of the patient were reviewed by me and considered in my medical decision making (see chart for  details).  X-ray of the right knee is pending.  Patient will be contacted if the results of the x-ray are abnormal.  Patient with history of osteoarthritis  in the left knee, suspect same diagnoses of osteoarthritis in the right knee given the sudden onset of pain and inflammatory nature of symptoms.  Decadron  10 mg IM and Toradol 30 mg IM administered for pain and inflammation.  Will start patient on prednisone  for the next 5 days.  Supportive care recommendations were provided and discussed with the patient to include over-the-counter analgesics and RICE therapy.  Patient was advised to follow-up with orthopedics if symptoms fail to improve with this treatment.  Patient was in agreement with this plan of care and verbalized understanding.  All questions were answered.  Patient stable for discharge.   Final Clinical Impressions(s) / UC Diagnoses   Final diagnoses:  Right knee pain, unspecified chronicity  Pain and swelling of knee, right     Discharge Instructions      X-ray of the right knee is pending.  You will be contacted when the results of the x-ray are received.  You will also have access to the results.  You MyChart. You were given an injection of Toradol 30 mg and Decadron  10 mg.  Do not take any additional NSAIDs such as Mobic , ibuprofen, Aleve, Advil, or naproxen for the next 24 hours.  You may take over-the-counter Tylenol for breakthrough pain or discomfort. RICE therapy, rest, ice, compression, and elevation.  Apply ice for 20 minutes, remove for 1 hour, repeat is much as possible to help with pain and swelling of the knee.  Also recommend the use of an Ace wrap to provide additional compression and support. As discussed, if symptoms fail to improve with this treatment, it is recommended that you follow-up with orthopedics for further evaluation. Follow-up as needed.   ED Prescriptions     Medication Sig Dispense Auth. Provider   predniSONE  (DELTASONE ) 20 MG tablet Take 2  tablets (40 mg total) by mouth daily with breakfast for 5 days. 10 tablet Leath-Warren, Belen Bowers, NP      PDMP not reviewed this encounter.   Hardy Lia, NP 06/09/23 1944

## 2023-06-09 NOTE — ED Triage Notes (Addendum)
 Pt reports right knee pain, difficulty bending the knee, swelling in the knee. Started today

## 2023-06-10 ENCOUNTER — Ambulatory Visit (HOSPITAL_COMMUNITY): Payer: Self-pay

## 2023-06-17 DIAGNOSIS — Z1151 Encounter for screening for human papillomavirus (HPV): Secondary | ICD-10-CM | POA: Diagnosis not present

## 2023-06-17 DIAGNOSIS — R87615 Unsatisfactory cytologic smear of cervix: Secondary | ICD-10-CM | POA: Diagnosis not present

## 2023-07-26 DIAGNOSIS — I10 Essential (primary) hypertension: Secondary | ICD-10-CM | POA: Diagnosis not present

## 2023-12-21 ENCOUNTER — Other Ambulatory Visit: Payer: Self-pay | Admitting: Orthopedic Surgery

## 2023-12-21 DIAGNOSIS — M1710 Unilateral primary osteoarthritis, unspecified knee: Secondary | ICD-10-CM

## 2023-12-21 DIAGNOSIS — M25562 Pain in left knee: Secondary | ICD-10-CM

## 2023-12-21 DIAGNOSIS — M1712 Unilateral primary osteoarthritis, left knee: Secondary | ICD-10-CM

## 2023-12-21 MED ORDER — MELOXICAM 7.5 MG PO TABS: 7.5000 mg | ORAL_TABLET | Freq: Two times a day (BID) | ORAL | 5 refills | Status: AC

## 2023-12-21 NOTE — Telephone Encounter (Signed)
 Refill request received via fax for Meloxicam  [employer]
